# Patient Record
Sex: Male | Born: 1950 | Race: Black or African American | Hispanic: No | Smoking: Current every day smoker
Health system: Southern US, Community
[De-identification: ages and names within clinical notes are randomized; demographics above are authoritative.]

## PROBLEM LIST (undated history)

## (undated) DIAGNOSIS — E7800 Pure hypercholesterolemia, unspecified: Secondary | ICD-10-CM

## (undated) DIAGNOSIS — M549 Dorsalgia, unspecified: Secondary | ICD-10-CM

## (undated) DIAGNOSIS — I10 Essential (primary) hypertension: Secondary | ICD-10-CM

## (undated) DIAGNOSIS — C61 Malignant neoplasm of prostate: Secondary | ICD-10-CM

---

## 2001-06-06 ENCOUNTER — Emergency Department (HOSPITAL_COMMUNITY): Admission: EM | Admit: 2001-06-06 | Discharge: 2001-06-06 | Payer: Self-pay | Admitting: Emergency Medicine

## 2001-06-06 ENCOUNTER — Encounter: Payer: Self-pay | Admitting: Emergency Medicine

## 2002-06-04 ENCOUNTER — Emergency Department (HOSPITAL_COMMUNITY): Admission: EM | Admit: 2002-06-04 | Discharge: 2002-06-04 | Payer: Self-pay | Admitting: Emergency Medicine

## 2002-07-14 ENCOUNTER — Emergency Department (HOSPITAL_COMMUNITY): Admission: EM | Admit: 2002-07-14 | Discharge: 2002-07-14 | Payer: Self-pay | Admitting: Emergency Medicine

## 2005-03-25 ENCOUNTER — Encounter (INDEPENDENT_AMBULATORY_CARE_PROVIDER_SITE_OTHER): Payer: Self-pay | Admitting: Internal Medicine

## 2005-12-23 ENCOUNTER — Ambulatory Visit: Payer: Self-pay | Admitting: Internal Medicine

## 2005-12-28 ENCOUNTER — Encounter (INDEPENDENT_AMBULATORY_CARE_PROVIDER_SITE_OTHER): Payer: Self-pay | Admitting: Internal Medicine

## 2005-12-28 ENCOUNTER — Ambulatory Visit (HOSPITAL_COMMUNITY): Admission: RE | Admit: 2005-12-28 | Discharge: 2005-12-28 | Payer: Self-pay | Admitting: Internal Medicine

## 2006-01-12 ENCOUNTER — Encounter (HOSPITAL_COMMUNITY): Admission: RE | Admit: 2006-01-12 | Discharge: 2006-02-11 | Payer: Self-pay | Admitting: Internal Medicine

## 2006-01-12 ENCOUNTER — Encounter (INDEPENDENT_AMBULATORY_CARE_PROVIDER_SITE_OTHER): Payer: Self-pay | Admitting: Internal Medicine

## 2006-01-20 ENCOUNTER — Ambulatory Visit: Payer: Self-pay | Admitting: Internal Medicine

## 2006-02-17 ENCOUNTER — Ambulatory Visit: Payer: Self-pay | Admitting: Internal Medicine

## 2006-03-27 ENCOUNTER — Encounter (INDEPENDENT_AMBULATORY_CARE_PROVIDER_SITE_OTHER): Payer: Self-pay | Admitting: Internal Medicine

## 2006-03-31 ENCOUNTER — Ambulatory Visit: Payer: Self-pay | Admitting: Internal Medicine

## 2006-05-07 ENCOUNTER — Encounter (INDEPENDENT_AMBULATORY_CARE_PROVIDER_SITE_OTHER): Payer: Self-pay | Admitting: Internal Medicine

## 2006-05-08 ENCOUNTER — Encounter: Payer: Self-pay | Admitting: Internal Medicine

## 2006-05-08 DIAGNOSIS — M545 Low back pain: Secondary | ICD-10-CM

## 2006-05-08 DIAGNOSIS — H269 Unspecified cataract: Secondary | ICD-10-CM

## 2006-05-08 DIAGNOSIS — D649 Anemia, unspecified: Secondary | ICD-10-CM | POA: Insufficient documentation

## 2006-05-08 DIAGNOSIS — H409 Unspecified glaucoma: Secondary | ICD-10-CM | POA: Insufficient documentation

## 2006-05-08 DIAGNOSIS — M1990 Unspecified osteoarthritis, unspecified site: Secondary | ICD-10-CM | POA: Insufficient documentation

## 2006-05-08 DIAGNOSIS — E785 Hyperlipidemia, unspecified: Secondary | ICD-10-CM | POA: Insufficient documentation

## 2006-05-08 DIAGNOSIS — I10 Essential (primary) hypertension: Secondary | ICD-10-CM | POA: Insufficient documentation

## 2006-06-11 ENCOUNTER — Ambulatory Visit (HOSPITAL_COMMUNITY): Admission: RE | Admit: 2006-06-11 | Discharge: 2006-06-11 | Payer: Self-pay | Admitting: Family Medicine

## 2006-07-20 ENCOUNTER — Encounter (INDEPENDENT_AMBULATORY_CARE_PROVIDER_SITE_OTHER): Payer: Self-pay | Admitting: Specialist

## 2006-08-03 ENCOUNTER — Encounter (HOSPITAL_COMMUNITY): Admission: RE | Admit: 2006-08-03 | Discharge: 2006-09-02 | Payer: Self-pay | Admitting: Urology

## 2007-01-12 ENCOUNTER — Ambulatory Visit (HOSPITAL_COMMUNITY): Admission: RE | Admit: 2007-01-12 | Discharge: 2007-01-12 | Payer: Self-pay | Admitting: Radiation Oncology

## 2007-03-25 ENCOUNTER — Ambulatory Visit: Admission: RE | Admit: 2007-03-25 | Discharge: 2007-05-24 | Payer: Self-pay | Admitting: Radiation Oncology

## 2007-09-17 ENCOUNTER — Emergency Department (HOSPITAL_COMMUNITY): Admission: EM | Admit: 2007-09-17 | Discharge: 2007-09-17 | Payer: Self-pay | Admitting: Emergency Medicine

## 2013-01-24 ENCOUNTER — Emergency Department (HOSPITAL_COMMUNITY)
Admission: EM | Admit: 2013-01-24 | Discharge: 2013-01-24 | Disposition: A | Discharge status: Home / Self Care | Payer: PRIVATE HEALTH INSURANCE | Attending: Emergency Medicine | Admitting: Emergency Medicine

## 2013-01-24 ENCOUNTER — Encounter (HOSPITAL_COMMUNITY): Payer: Self-pay | Admitting: *Deleted

## 2013-01-24 DIAGNOSIS — Z79899 Other long term (current) drug therapy: Secondary | ICD-10-CM | POA: Insufficient documentation

## 2013-01-24 DIAGNOSIS — IMO0002 Reserved for concepts with insufficient information to code with codable children: Secondary | ICD-10-CM | POA: Insufficient documentation

## 2013-01-24 DIAGNOSIS — L02411 Cutaneous abscess of right axilla: Secondary | ICD-10-CM

## 2013-01-24 DIAGNOSIS — F17200 Nicotine dependence, unspecified, uncomplicated: Secondary | ICD-10-CM | POA: Insufficient documentation

## 2013-01-24 DIAGNOSIS — I10 Essential (primary) hypertension: Secondary | ICD-10-CM | POA: Insufficient documentation

## 2013-01-24 DIAGNOSIS — Z7982 Long term (current) use of aspirin: Secondary | ICD-10-CM | POA: Insufficient documentation

## 2013-01-24 DIAGNOSIS — E7800 Pure hypercholesterolemia, unspecified: Secondary | ICD-10-CM | POA: Insufficient documentation

## 2013-01-24 HISTORY — DX: Essential (primary) hypertension: I10

## 2013-01-24 HISTORY — DX: Pure hypercholesterolemia, unspecified: E78.00

## 2013-01-24 HISTORY — DX: Dorsalgia, unspecified: M54.9

## 2013-01-24 MED ORDER — LIDOCAINE HCL (PF) 1 % IJ SOLN
5.0000 mL | Freq: Once | INTRAMUSCULAR | Status: AC
  Administered 2013-01-24: 5 mL via INTRADERMAL
  Filled 2013-01-24: qty 5

## 2013-01-24 MED ORDER — SULFAMETHOXAZOLE-TRIMETHOPRIM 800-160 MG PO TABS: 1.0000 | ORAL_TABLET | Freq: Two times a day (BID) | ORAL | Status: DC

## 2013-01-24 MED ORDER — HYDROCODONE-ACETAMINOPHEN 5-325 MG PO TABS
1.0000 | ORAL_TABLET | Freq: Once | ORAL | Status: AC
  Administered 2013-01-24: 1 via ORAL
  Filled 2013-01-24: qty 1

## 2013-01-24 MED ORDER — SULFAMETHOXAZOLE-TMP DS 800-160 MG PO TABS
1.0000 | ORAL_TABLET | Freq: Once | ORAL | Status: AC
  Administered 2013-01-24: 1 via ORAL
  Filled 2013-01-24: qty 1

## 2013-01-24 MED ORDER — HYDROCODONE-ACETAMINOPHEN 5-325 MG PO TABS: ORAL_TABLET | ORAL | Status: DC

## 2013-01-24 NOTE — ED Notes (Signed)
Wound care provided. Wound dressed with sterile non-adherent telfa dressing. Secured with sterile gauze, kerlex and tape. Wound care instruction given to patient with verbal understanding obtained.

## 2013-01-24 NOTE — ED Notes (Signed)
Reports multiple abscesses under right axillary x 4 days.

## 2013-01-24 NOTE — ED Notes (Signed)
Patient reports being unable to fill his prescriptions until tomorrow due to money. Kem Parkinson, PA made aware. Patient given dose of pain medication and antibiotic prior to discharge.

## 2013-01-24 NOTE — ED Notes (Signed)
Patient with no complaints at this time. Respirations even and unlabored. Skin warm/dry. Discharge instructions reviewed with patient at this time. Patient given opportunity to voice concerns/ask questions. Patient discharged at this time and left Emergency Department with steady gait.   

## 2013-01-24 NOTE — ED Provider Notes (Signed)
CSN: [id number]     Arrival date & time 01/24/13  0944 History   First MD Initiated Contact with Patient 01/24/13 1016     Chief Complaint  Patient presents with  . Abscess   (Consider location/radiation/quality/duration/timing/severity/associated sxs/prior Treatment) Patient is a 62 y.o. male presenting with abscess. The history is provided by the patient.  Abscess Location:  Shoulder/arm Shoulder/arm abscess location:  R axilla Abscess quality: fluctuance, induration, painful and redness   Red streaking: no   Duration:  4 days Progression:  Worsening Pain details:    Quality:  Aching   Severity:  Mild   Timing:  Constant   Progression:  Worsening Chronicity:  New Context: not diabetes, not immunosuppression, not insect bite/sting and not skin injury   Relieved by:  Nothing Worsened by:  Nothing tried Ineffective treatments:  None tried Associated symptoms: no fever, no headaches, no nausea and no vomiting   Risk factors: no hx of MRSA and no prior abscess     Past Medical History  Diagnosis Date  . Hypertension   . Back pain   . High cholesterol    History reviewed. No pertinent past surgical history. No family history on file. History  Substance Use Topics  . Smoking status: Current Every Day Smoker    Types: Cigarettes  . Smokeless tobacco: Not on file  . Alcohol Use: Yes     Comment: occasional    Review of Systems  Constitutional: Negative for fever and chills.  Gastrointestinal: Negative for nausea and vomiting.  Musculoskeletal: Negative for joint swelling and arthralgias.  Skin: Positive for color change.       Abscess   Neurological: Negative for headaches.  Hematological: Negative for adenopathy.  All other systems reviewed and are negative.    Allergies  Review of patient's allergies indicates no known allergies.  Home Medications   Current Outpatient Rx  Name  Route  Sig  Dispense  Refill  . amLODipine (NORVASC) 10 MG tablet   Oral  Take 10 mg by mouth daily.         Marland Kitchen aspirin EC 81 MG tablet   Oral   Take 81 mg by mouth daily.         . naproxen (NAPROSYN) 500 MG tablet   Oral   Take 500 mg by mouth 2 (two) times daily with a meal.         . rosuvastatin (CRESTOR) 10 MG tablet   Oral   Take 10 mg by mouth daily.          BP 161/63  Pulse 77  Temp(Src) 98.8 F (37.1 C) (Oral)  Resp 16  Ht 5\' 6"  (1.676 m)  Wt 154 lb (69.854 kg)  BMI 24.87 kg/m2  SpO2 97% Physical Exam  Nursing note and vitals reviewed. Constitutional: He is oriented to person, place, and time. He appears well-developed and well-nourished. No distress.  HENT:  Head: Normocephalic and atraumatic.  Cardiovascular: Normal rate, regular rhythm and normal heart sounds.   No murmur heard. Pulmonary/Chest: Effort normal and breath sounds normal. No respiratory distress.  Musculoskeletal: Normal range of motion.  Neurological: He is alert and oriented to person, place, and time. He exhibits normal muscle tone. Coordination normal.  Skin: Skin is warm and dry. There is erythema.  Two abscesses to the right axilla.  Two pea sized pustules.  No significant surrounding erythema.      ED Course  Procedures (including critical care time) Labs Review Labs Reviewed -  No data to display Imaging Review No results found.  MDM    INCISION AND DRAINAGE Performed by: Hale Bogus. Consent: Verbal consent obtained. Risks and benefits: risks, benefits and alternatives were discussed Type: abscess #1  Body area: right axilla Anesthesia: local infiltration  Incision was made with a #11 scalpel.  Local anesthetic: lidocaine 1 % w/o epinephrine  Anesthetic total: 2 ml  Complexity: complex Blunt dissection to break up loculations  Drainage: purulent  Drainage amount: moderate Packing material: 1/4 in iodoform gauze  Patient tolerance: Patient tolerated the procedure well with no immediate complications.   INCISION AND  DRAINAGE Performed by: Hale Bogus. Consent: Verbal consent obtained. Risks and benefits: risks, benefits and alternatives were discussed Type: abscess #2  Body area: right axilla Anesthesia: local infiltration  Incision was made with a #11 scalpel.  Local anesthetic: lidocaine 1% w/o epinephrine  Anesthetic total: 2 ml  Complexity: complex Blunt dissection to break up loculations  Drainage: purulent  Drainage amount: mild  Packing material: 1/4 in iodoform gauze  Patient tolerance: Patient tolerated the procedure well with no immediate complications.    patient also has two pea sized nodules to the right axilla that do not appear to need I&D at this time.  Patient agrees to keep area bandaged and to return here in 2 days for recheck and packing removal.  VSS.  He is feeling better and appears stable for discharge.      Gilliam Hawkes L. Vanessa [place], PA-C 01/25/13 2009

## 2013-01-26 ENCOUNTER — Emergency Department (HOSPITAL_COMMUNITY)
Admission: EM | Admit: 2013-01-26 | Discharge: 2013-01-26 | Disposition: A | Discharge status: Home / Self Care | Payer: PRIVATE HEALTH INSURANCE | Attending: Emergency Medicine | Admitting: Emergency Medicine

## 2013-01-26 ENCOUNTER — Encounter (HOSPITAL_COMMUNITY): Payer: Self-pay | Admitting: Emergency Medicine

## 2013-01-26 DIAGNOSIS — IMO0002 Reserved for concepts with insufficient information to code with codable children: Secondary | ICD-10-CM | POA: Insufficient documentation

## 2013-01-26 DIAGNOSIS — L02411 Cutaneous abscess of right axilla: Secondary | ICD-10-CM

## 2013-01-26 DIAGNOSIS — E7800 Pure hypercholesterolemia, unspecified: Secondary | ICD-10-CM | POA: Insufficient documentation

## 2013-01-26 DIAGNOSIS — Z7982 Long term (current) use of aspirin: Secondary | ICD-10-CM | POA: Insufficient documentation

## 2013-01-26 DIAGNOSIS — I10 Essential (primary) hypertension: Secondary | ICD-10-CM | POA: Insufficient documentation

## 2013-01-26 DIAGNOSIS — F17200 Nicotine dependence, unspecified, uncomplicated: Secondary | ICD-10-CM | POA: Insufficient documentation

## 2013-01-26 DIAGNOSIS — Z4801 Encounter for change or removal of surgical wound dressing: Secondary | ICD-10-CM | POA: Insufficient documentation

## 2013-01-26 DIAGNOSIS — Z79899 Other long term (current) drug therapy: Secondary | ICD-10-CM | POA: Insufficient documentation

## 2013-01-26 DIAGNOSIS — Z5189 Encounter for other specified aftercare: Secondary | ICD-10-CM

## 2013-01-26 NOTE — ED Notes (Addendum)
Patient reports had two abscesses to right underarm lanced on Tuesday and came back for recheck and packing removal.

## 2013-01-26 NOTE — ED Notes (Signed)
Discharge instructions reviewed with pt, questions answered. Pt verbalized understanding.  

## 2013-01-26 NOTE — ED Provider Notes (Signed)
CSN: [id number]     Arrival date & time 01/26/13  [id number] History   First MD Initiated Contact with Patient 01/26/13 [phone]     Chief Complaint  Patient presents with  . Abscess    HPI Pt was seen at 0705.  Per pt, c/o gradual onset and persistence of constant abscess x2 to right axillary area. Pt had I&D of the abscesses 2 days ago and has come back to the ED today for a re-check and packing removal. States he has been changing the bandage regularly. Denies any complaints. Denies fevers, no worsening pain, no rash.     Past Medical History  Diagnosis Date  . Hypertension   . Back pain   . High cholesterol    History reviewed. No pertinent past surgical history.  History  Substance Use Topics  . Smoking status: Current Every Day Smoker    Types: Cigarettes  . Smokeless tobacco: Not on file  . Alcohol Use: Yes     Comment: occasional    Review of Systems ROS: Statement: All systems negative except as marked or noted in the HPI; Constitutional: Negative for fever and chills. ; ; Eyes: Negative for eye pain, redness and discharge. ; ; ENMT: Negative for ear pain, hoarseness, nasal congestion, sinus pressure and sore throat. ; ; Cardiovascular: Negative for chest pain, palpitations, diaphoresis, dyspnea and peripheral edema. ; ; Respiratory: Negative for cough, wheezing and stridor. ; ; Gastrointestinal: Negative for nausea, vomiting, diarrhea, abdominal pain, blood in stool, hematemesis, jaundice and rectal bleeding. . ; ; Genitourinary: Negative for dysuria, flank pain and hematuria. ; ; Musculoskeletal: Negative for back pain and neck pain. Negative for swelling and trauma.; ; Skin: Negative for pruritus, abrasions, blisters, bruising and +skin lesion.; ; Neuro: Negative for headache, lightheadedness and neck stiffness. Negative for weakness, altered level of consciousness , altered mental status, extremity weakness, paresthesias, involuntary movement, seizure and syncope.       Allergies   Review of patient's allergies indicates no known allergies.  Home Medications   Current Outpatient Rx  Name  Route  Sig  Dispense  Refill  . amLODipine (NORVASC) 10 MG tablet   Oral   Take 10 mg by mouth daily.         Marland Kitchen aspirin EC 81 MG tablet   Oral   Take 81 mg by mouth daily.         Marland Kitchen HYDROcodone-acetaminophen (NORCO/VICODIN) 5-325 MG per tablet      Take one-two tabs po q 4-6 hrs prn pain   20 tablet   0   . naproxen (NAPROSYN) 500 MG tablet   Oral   Take 500 mg by mouth 2 (two) times daily with a meal.         . rosuvastatin (CRESTOR) 10 MG tablet   Oral   Take 10 mg by mouth daily.         Marland Kitchen sulfamethoxazole-trimethoprim (SEPTRA DS) 800-160 MG per tablet   Oral   Take 1 tablet by mouth 2 (two) times daily.   20 tablet   0    BP 142/60  Pulse 64  Temp(Src) 97.4 F (36.3 C) (Oral)  Resp 14  Ht 5\' 6"  (1.676 m)  Wt 155 lb (70.308 kg)  BMI 25.03 kg/m2  SpO2 99% Physical Exam 0710: Physical examination:  Nursing notes reviewed; Vital signs and O2 SAT reviewed;  Constitutional: Well developed, Well nourished, Well hydrated, In no acute distress; Head:  Normocephalic, atraumatic; Eyes: EOMI, PERRL,  No scleral icterus; ENMT: Mouth and pharynx normal, Mucous membranes moist; Neck: Supple, Full range of motion; Cardiovascular: Regular rate and rhythm, No gallop; Respiratory: Breath sounds clear & equal bilaterally, No rales, rhonchi, wheezes.  Speaking full sentences with ease, Normal respiratory effort/excursion; Chest: Nontender, Movement normal;; Extremities: Pulses normal, No tenderness, No deformity..; Neuro: AA&Ox3, Major CN grossly intact.  Speech clear. Gait steady. No gross focal motor or sensory deficits in extremities.; Skin: Color normal, Warm, Dry, +2 abscesses s/p I&D right axilla without surrounding erythema, no streaking.   ED Course  Procedures   MDM  MDM Reviewed: previous chart, nursing note and vitals     0720:  Packing removed from  2 abscesses to right axillary area. No surrounding cellulitis. Will have pt continue abx and warm soaks; f/u PMD or General Surgeon. Dx d/w pt.  Questions answered. Verb understanding, agreeable to d/c home with outpt f/u.   Alfonzo Feller, DO 01/29/13 1153

## 2013-01-27 NOTE — ED Provider Notes (Signed)
Medical screening examination/treatment/procedure(s) were performed by non-physician practitioner and as supervising physician I was immediately available for consultation/collaboration.     Kathalene Frames, MD 01/27/13 [phone]

## 2014-02-01 ENCOUNTER — Ambulatory Visit (HOSPITAL_COMMUNITY)
Admission: RE | Admit: 2014-02-01 | Discharge: 2014-02-01 | Disposition: A | Discharge status: Home / Self Care | Payer: PRIVATE HEALTH INSURANCE | Source: Ambulatory Visit | Attending: Family Medicine | Admitting: Family Medicine

## 2014-02-01 ENCOUNTER — Other Ambulatory Visit (HOSPITAL_COMMUNITY): Payer: Self-pay | Admitting: Family Medicine

## 2014-02-01 DIAGNOSIS — M25562 Pain in left knee: Secondary | ICD-10-CM

## 2014-02-01 DIAGNOSIS — M25539 Pain in unspecified wrist: Secondary | ICD-10-CM | POA: Diagnosis present

## 2014-02-01 DIAGNOSIS — M778 Other enthesopathies, not elsewhere classified: Secondary | ICD-10-CM | POA: Insufficient documentation

## 2016-08-04 ENCOUNTER — Encounter (INDEPENDENT_AMBULATORY_CARE_PROVIDER_SITE_OTHER): Payer: Self-pay | Admitting: *Deleted

## 2017-07-30 ENCOUNTER — Ambulatory Visit (HOSPITAL_COMMUNITY)
Admission: RE | Admit: 2017-07-30 | Discharge: 2017-07-30 | Disposition: A | Discharge status: Home / Self Care | Payer: Medicare Other | Source: Ambulatory Visit | Attending: Family Medicine | Admitting: Family Medicine

## 2017-07-30 ENCOUNTER — Other Ambulatory Visit (HOSPITAL_COMMUNITY): Payer: Self-pay | Admitting: Family Medicine

## 2017-07-30 DIAGNOSIS — G8929 Other chronic pain: Secondary | ICD-10-CM | POA: Insufficient documentation

## 2017-07-30 DIAGNOSIS — M5136 Other intervertebral disc degeneration, lumbar region: Secondary | ICD-10-CM

## 2017-07-30 DIAGNOSIS — I700 Atherosclerosis of aorta: Secondary | ICD-10-CM | POA: Diagnosis not present

## 2017-07-30 DIAGNOSIS — M5442 Lumbago with sciatica, left side: Secondary | ICD-10-CM | POA: Diagnosis present

## 2017-07-30 DIAGNOSIS — M158 Other polyosteoarthritis: Secondary | ICD-10-CM

## 2017-08-20 ENCOUNTER — Other Ambulatory Visit (HOSPITAL_COMMUNITY): Payer: Self-pay | Admitting: Family Medicine

## 2017-08-20 DIAGNOSIS — M545 Low back pain: Secondary | ICD-10-CM

## 2017-09-06 ENCOUNTER — Ambulatory Visit (HOSPITAL_COMMUNITY)
Admission: RE | Admit: 2017-09-06 | Discharge: 2017-09-06 | Disposition: A | Discharge status: Home / Self Care | Payer: Medicare Other | Source: Ambulatory Visit | Attending: Family Medicine | Admitting: Family Medicine

## 2017-09-06 DIAGNOSIS — M48061 Spinal stenosis, lumbar region without neurogenic claudication: Secondary | ICD-10-CM | POA: Insufficient documentation

## 2017-09-06 DIAGNOSIS — M4726 Other spondylosis with radiculopathy, lumbar region: Secondary | ICD-10-CM | POA: Insufficient documentation

## 2017-09-06 DIAGNOSIS — M545 Low back pain: Secondary | ICD-10-CM | POA: Insufficient documentation

## 2018-03-22 ENCOUNTER — Other Ambulatory Visit: Payer: Self-pay | Admitting: Neurological Surgery

## 2018-03-22 DIAGNOSIS — M5416 Radiculopathy, lumbar region: Secondary | ICD-10-CM

## 2018-04-12 ENCOUNTER — Ambulatory Visit
Admission: RE | Admit: 2018-04-12 | Discharge: 2018-04-12 | Disposition: A | Discharge status: Home / Self Care | Payer: Medicaid Other | Source: Ambulatory Visit | Attending: Neurological Surgery | Admitting: Neurological Surgery

## 2018-04-12 DIAGNOSIS — M5416 Radiculopathy, lumbar region: Secondary | ICD-10-CM

## 2018-04-12 MED ORDER — IOPAMIDOL (ISOVUE-M 200) INJECTION 41%
1.0000 mL | Freq: Once | INTRAMUSCULAR | Status: AC
  Administered 2018-04-12: 1 mL via EPIDURAL

## 2018-04-12 MED ORDER — METHYLPREDNISOLONE ACETATE 40 MG/ML INJ SUSP (RADIOLOG
120.0000 mg | Freq: Once | INTRAMUSCULAR | Status: AC
  Administered 2018-04-12: 120 mg via EPIDURAL

## 2018-04-12 NOTE — Discharge Instructions (Signed)

## 2020-02-08 ENCOUNTER — Inpatient Hospital Stay (HOSPITAL_COMMUNITY)
Admission: EM | Admit: 2020-02-08 | Discharge: 2020-02-23 | DRG: 682 | Disposition: E | Payer: Medicare Other | Attending: Internal Medicine | Admitting: Internal Medicine

## 2020-02-08 ENCOUNTER — Inpatient Hospital Stay (HOSPITAL_COMMUNITY): Payer: Medicare Other

## 2020-02-08 ENCOUNTER — Emergency Department (HOSPITAL_COMMUNITY): Payer: Medicare Other

## 2020-02-08 ENCOUNTER — Encounter (HOSPITAL_COMMUNITY): Payer: Self-pay

## 2020-02-08 DIAGNOSIS — R001 Bradycardia, unspecified: Secondary | ICD-10-CM | POA: Diagnosis present

## 2020-02-08 DIAGNOSIS — F17210 Nicotine dependence, cigarettes, uncomplicated: Secondary | ICD-10-CM | POA: Diagnosis present

## 2020-02-08 DIAGNOSIS — E43 Unspecified severe protein-calorie malnutrition: Secondary | ICD-10-CM | POA: Diagnosis not present

## 2020-02-08 DIAGNOSIS — Y842 Radiological procedure and radiotherapy as the cause of abnormal reaction of the patient, or of later complication, without mention of misadventure at the time of the procedure: Secondary | ICD-10-CM | POA: Diagnosis not present

## 2020-02-08 DIAGNOSIS — C7802 Secondary malignant neoplasm of left lung: Secondary | ICD-10-CM | POA: Diagnosis present

## 2020-02-08 DIAGNOSIS — C7801 Secondary malignant neoplasm of right lung: Secondary | ICD-10-CM | POA: Diagnosis not present

## 2020-02-08 DIAGNOSIS — N186 End stage renal disease: Secondary | ICD-10-CM | POA: Diagnosis present

## 2020-02-08 DIAGNOSIS — Z515 Encounter for palliative care: Secondary | ICD-10-CM | POA: Diagnosis not present

## 2020-02-08 DIAGNOSIS — Z20822 Contact with and (suspected) exposure to covid-19: Secondary | ICD-10-CM | POA: Diagnosis present

## 2020-02-08 DIAGNOSIS — N179 Acute kidney failure, unspecified: Principal | ICD-10-CM | POA: Diagnosis present

## 2020-02-08 DIAGNOSIS — E875 Hyperkalemia: Secondary | ICD-10-CM | POA: Diagnosis present

## 2020-02-08 DIAGNOSIS — Z7189 Other specified counseling: Secondary | ICD-10-CM | POA: Diagnosis not present

## 2020-02-08 DIAGNOSIS — G9341 Metabolic encephalopathy: Secondary | ICD-10-CM | POA: Diagnosis not present

## 2020-02-08 DIAGNOSIS — R570 Cardiogenic shock: Secondary | ICD-10-CM | POA: Diagnosis present

## 2020-02-08 DIAGNOSIS — I120 Hypertensive chronic kidney disease with stage 5 chronic kidney disease or end stage renal disease: Secondary | ICD-10-CM | POA: Diagnosis present

## 2020-02-08 DIAGNOSIS — Z66 Do not resuscitate: Secondary | ICD-10-CM | POA: Diagnosis not present

## 2020-02-08 DIAGNOSIS — E785 Hyperlipidemia, unspecified: Secondary | ICD-10-CM | POA: Diagnosis not present

## 2020-02-08 DIAGNOSIS — J9601 Acute respiratory failure with hypoxia: Secondary | ICD-10-CM | POA: Diagnosis not present

## 2020-02-08 DIAGNOSIS — N170 Acute kidney failure with tubular necrosis: Secondary | ICD-10-CM | POA: Diagnosis not present

## 2020-02-08 DIAGNOSIS — Z7982 Long term (current) use of aspirin: Secondary | ICD-10-CM | POA: Diagnosis not present

## 2020-02-08 DIAGNOSIS — K7200 Acute and subacute hepatic failure without coma: Secondary | ICD-10-CM | POA: Diagnosis present

## 2020-02-08 DIAGNOSIS — N171 Acute kidney failure with acute cortical necrosis: Secondary | ICD-10-CM | POA: Diagnosis not present

## 2020-02-08 DIAGNOSIS — Z79899 Other long term (current) drug therapy: Secondary | ICD-10-CM

## 2020-02-08 DIAGNOSIS — D62 Acute posthemorrhagic anemia: Secondary | ICD-10-CM | POA: Diagnosis not present

## 2020-02-08 DIAGNOSIS — C787 Secondary malignant neoplasm of liver and intrahepatic bile duct: Secondary | ICD-10-CM | POA: Diagnosis present

## 2020-02-08 DIAGNOSIS — N3041 Irradiation cystitis with hematuria: Secondary | ICD-10-CM | POA: Diagnosis not present

## 2020-02-08 DIAGNOSIS — R680 Hypothermia, not associated with low environmental temperature: Secondary | ICD-10-CM | POA: Diagnosis not present

## 2020-02-08 DIAGNOSIS — Z6820 Body mass index (BMI) 20.0-20.9, adult: Secondary | ICD-10-CM

## 2020-02-08 DIAGNOSIS — E872 Acidosis: Secondary | ICD-10-CM | POA: Diagnosis present

## 2020-02-08 DIAGNOSIS — J9690 Respiratory failure, unspecified, unspecified whether with hypoxia or hypercapnia: Secondary | ICD-10-CM

## 2020-02-08 DIAGNOSIS — E7800 Pure hypercholesterolemia, unspecified: Secondary | ICD-10-CM | POA: Diagnosis present

## 2020-02-08 DIAGNOSIS — T68XXXA Hypothermia, initial encounter: Secondary | ICD-10-CM

## 2020-02-08 DIAGNOSIS — J9600 Acute respiratory failure, unspecified whether with hypoxia or hypercapnia: Secondary | ICD-10-CM | POA: Diagnosis not present

## 2020-02-08 DIAGNOSIS — E162 Hypoglycemia, unspecified: Secondary | ICD-10-CM | POA: Diagnosis not present

## 2020-02-08 DIAGNOSIS — I313 Pericardial effusion (noninflammatory): Secondary | ICD-10-CM | POA: Diagnosis not present

## 2020-02-08 DIAGNOSIS — M898X9 Other specified disorders of bone, unspecified site: Secondary | ICD-10-CM | POA: Diagnosis present

## 2020-02-08 DIAGNOSIS — Z452 Encounter for adjustment and management of vascular access device: Secondary | ICD-10-CM

## 2020-02-08 DIAGNOSIS — C61 Malignant neoplasm of prostate: Secondary | ICD-10-CM | POA: Diagnosis present

## 2020-02-08 DIAGNOSIS — Z01818 Encounter for other preprocedural examination: Secondary | ICD-10-CM

## 2020-02-08 HISTORY — DX: Malignant neoplasm of prostate: C61

## 2020-02-08 LAB — CBC WITH DIFFERENTIAL/PLATELET
Abs Immature Granulocytes: 0.22 10*3/uL — ABNORMAL HIGH (ref 0.00–0.07)
Basophils Absolute: 0 10*3/uL (ref 0.0–0.1)
Basophils Relative: 0 %
Eosinophils Absolute: 0 10*3/uL (ref 0.0–0.5)
Eosinophils Relative: 0 %
HCT: 21.5 % — ABNORMAL LOW (ref 39.0–52.0)
Hemoglobin: 7.4 g/dL — ABNORMAL LOW (ref 13.0–17.0)
Immature Granulocytes: 1 %
Lymphocytes Relative: 9 %
Lymphs Abs: 1.5 10*3/uL (ref 0.7–4.0)
MCH: 24.4 pg — ABNORMAL LOW (ref 26.0–34.0)
MCHC: 34.4 g/dL (ref 30.0–36.0)
MCV: 71 fL — ABNORMAL LOW (ref 80.0–100.0)
Monocytes Absolute: 1.9 10*3/uL — ABNORMAL HIGH (ref 0.1–1.0)
Monocytes Relative: 11 %
Neutro Abs: 13.3 10*3/uL — ABNORMAL HIGH (ref 1.7–7.7)
Neutrophils Relative %: 79 %
Platelets: 403 10*3/uL — ABNORMAL HIGH (ref 150–400)
RBC: 3.03 MIL/uL — ABNORMAL LOW (ref 4.22–5.81)
RDW: 15.2 % (ref 11.5–15.5)
WBC: 16.9 10*3/uL — ABNORMAL HIGH (ref 4.0–10.5)
nRBC: 0 % (ref 0.0–0.2)

## 2020-02-08 LAB — I-STAT CHEM 8, ED
BUN: 84 mg/dL — ABNORMAL HIGH (ref 8–23)
BUN: 80 mg/dL — ABNORMAL HIGH (ref 8–23)
Calcium, Ion: 0.62 mmol/L — CL (ref 1.15–1.40)
Calcium, Ion: 0.64 mmol/L — CL (ref 1.15–1.40)
Chloride: 103 mmol/L (ref 98–111)
Chloride: 104 mmol/L (ref 98–111)
Creatinine, Ser: 14.5 mg/dL — ABNORMAL HIGH (ref 0.61–1.24)
Creatinine, Ser: 14.3 mg/dL — ABNORMAL HIGH (ref 0.61–1.24)
Glucose, Bld: 103 mg/dL — ABNORMAL HIGH (ref 70–99)
Glucose, Bld: 140 mg/dL — ABNORMAL HIGH (ref 70–99)
HCT: 22 % — ABNORMAL LOW (ref 39.0–52.0)
HCT: 19 % — ABNORMAL LOW (ref 39.0–52.0)
Hemoglobin: 7.5 g/dL — ABNORMAL LOW (ref 13.0–17.0)
Hemoglobin: 6.5 g/dL — CL (ref 13.0–17.0)
Potassium: 7 mmol/L (ref 3.5–5.1)
Potassium: 5.9 mmol/L — ABNORMAL HIGH (ref 3.5–5.1)
Sodium: 130 mmol/L — ABNORMAL LOW (ref 135–145)
Sodium: 134 mmol/L — ABNORMAL LOW (ref 135–145)
TCO2: 11 mmol/L — ABNORMAL LOW (ref 22–32)
TCO2: 13 mmol/L — ABNORMAL LOW (ref 22–32)

## 2020-02-08 LAB — SARS CORONAVIRUS 2 BY RT PCR (HOSPITAL ORDER, PERFORMED IN ~~LOC~~ HOSPITAL LAB): SARS Coronavirus 2: NEGATIVE

## 2020-02-08 LAB — MAGNESIUM: Magnesium: 2.6 mg/dL — ABNORMAL HIGH (ref 1.7–2.4)

## 2020-02-08 LAB — COMPREHENSIVE METABOLIC PANEL
ALT: 43 U/L (ref 0–44)
AST: 370 U/L — ABNORMAL HIGH (ref 15–41)
Albumin: 1.7 g/dL — ABNORMAL LOW (ref 3.5–5.0)
Alkaline Phosphatase: 391 U/L — ABNORMAL HIGH (ref 38–126)
Anion gap: >20 — ABNORMAL HIGH (ref 5–15)
BUN: 99 mg/dL — ABNORMAL HIGH (ref 8–23)
CO2: 10 mmol/L — ABNORMAL LOW (ref 22–32)
Calcium: 5.2 mg/dL — CL (ref 8.9–10.3)
Chloride: 99 mmol/L (ref 98–111)
Creatinine, Ser: 13.1 mg/dL — ABNORMAL HIGH (ref 0.61–1.24)
GFR calc Af Amer: 4 mL/min — ABNORMAL LOW (ref >60)
GFR calc non Af Amer: 3 mL/min — ABNORMAL LOW (ref >60)
Glucose, Bld: 109 mg/dL — ABNORMAL HIGH (ref 70–99)
Potassium: 7.3 mmol/L (ref 3.5–5.1)
Sodium: 133 mmol/L — ABNORMAL LOW (ref 135–145)
Total Bilirubin: 3.6 mg/dL — ABNORMAL HIGH (ref 0.3–1.2)
Total Protein: 5.8 g/dL — ABNORMAL LOW (ref 6.5–8.1)

## 2020-02-08 LAB — TYPE AND SCREEN
ABO/RH(D): A POS
Antibody Screen: NEGATIVE

## 2020-02-08 LAB — TROPONIN I (HIGH SENSITIVITY)
Troponin I (High Sensitivity): 89 ng/L — ABNORMAL HIGH (ref <18)
Troponin I (High Sensitivity): 85 ng/L — ABNORMAL HIGH (ref <18)

## 2020-02-08 LAB — PROCALCITONIN: Procalcitonin: 26.82 ng/mL

## 2020-02-08 LAB — POC OCCULT BLOOD, ED: Fecal Occult Bld: POSITIVE — AB

## 2020-02-08 LAB — GLUCOSE, CAPILLARY: Glucose-Capillary: 127 mg/dL — ABNORMAL HIGH (ref 70–99)

## 2020-02-08 LAB — CK: Total CK: 2982 U/L — ABNORMAL HIGH (ref 49–397)

## 2020-02-08 LAB — PHOSPHORUS: Phosphorus: 15.2 mg/dL — ABNORMAL HIGH (ref 2.5–4.6)

## 2020-02-08 LAB — PROTIME-INR
INR: 2.2 — ABNORMAL HIGH (ref 0.8–1.2)
Prothrombin Time: 23.3 seconds — ABNORMAL HIGH (ref 11.4–15.2)

## 2020-02-08 LAB — LACTIC ACID, PLASMA: Lactic Acid, Venous: 2 mmol/L (ref 0.5–1.9)

## 2020-02-08 LAB — CBG MONITORING, ED: Glucose-Capillary: 151 mg/dL — ABNORMAL HIGH (ref 70–99)

## 2020-02-08 MED ORDER — FUROSEMIDE 10 MG/ML IJ SOLN
INTRAMUSCULAR | Status: AC
  Filled 2020-02-08: qty 4

## 2020-02-08 MED ORDER — MAGNESIUM SULFATE 2 GM/50ML IV SOLN
2.0000 g | INTRAVENOUS | Status: AC
  Administered 2020-02-08: 2 g via INTRAVENOUS
  Filled 2020-02-08: qty 50

## 2020-02-08 MED ORDER — NOREPINEPHRINE 4 MG/250ML-% IV SOLN: 0.0000 ug/min | INTRAVENOUS | Status: DC

## 2020-02-08 MED ORDER — VASOPRESSIN 20 UNITS/100 ML INFUSION FOR SHOCK
0.0300 [IU]/min | INTRAVENOUS | Status: DC
  Administered 2020-02-08: 0.03 [IU]/min via INTRAVENOUS
  Administered 2020-02-09: 0.01 [IU]/min via INTRAVENOUS
  Filled 2020-02-08 (×2): qty 100

## 2020-02-08 MED ORDER — SODIUM CHLORIDE 0.9 % IV BOLUS
1000.0000 mL | Freq: Once | INTRAVENOUS | Status: AC
  Administered 2020-02-08: 1000 mL via INTRAVENOUS

## 2020-02-08 MED ORDER — HEPARIN (PORCINE) 2000 UNITS/L FOR CRRT
INTRAVENOUS_CENTRAL | Status: DC | PRN
  Filled 2020-02-08 (×2): qty 1000

## 2020-02-08 MED ORDER — SODIUM BICARBONATE 8.4 % IV SOLN
INTRAVENOUS | Status: AC
  Filled 2020-02-08: qty 50

## 2020-02-08 MED ORDER — HEPARIN SODIUM (PORCINE) 1000 UNIT/ML DIALYSIS
1000.0000 [IU] | INTRAMUSCULAR | Status: DC | PRN
  Administered 2020-02-09: 3100 [IU] via INTRAVENOUS_CENTRAL
  Filled 2020-02-08: qty 3
  Filled 2020-02-08 (×2): qty 6

## 2020-02-08 MED ORDER — INSULIN ASPART 100 UNIT/ML IV SOLN
5.0000 [IU] | Freq: Once | INTRAVENOUS | Status: AC
  Administered 2020-02-08: 5 [IU] via INTRAVENOUS

## 2020-02-08 MED ORDER — STERILE WATER FOR INJECTION IV SOLN
INTRAVENOUS | Status: DC
  Filled 2020-02-08 (×3): qty 9.62

## 2020-02-08 MED ORDER — POLYETHYLENE GLYCOL 3350 17 G PO PACK: 17.0000 g | PACK | Freq: Every day | ORAL | Status: DC | PRN

## 2020-02-08 MED ORDER — FUROSEMIDE 10 MG/ML IJ SOLN
80.0000 mg | Freq: Once | INTRAMUSCULAR | Status: AC
  Administered 2020-02-08: 80 mg via INTRAVENOUS
  Filled 2020-02-08: qty 8

## 2020-02-08 MED ORDER — NOREPINEPHRINE 4 MG/250ML-% IV SOLN: 2.0000 ug/min | INTRAVENOUS | Status: DC

## 2020-02-08 MED ORDER — SODIUM CHLORIDE 0.9 % IV SOLN: 250.0000 mL | INTRAVENOUS | Status: DC

## 2020-02-08 MED ORDER — DEXTROSE 50 % IV SOLN
1.0000 | Freq: Once | INTRAVENOUS | Status: AC
  Filled 2020-02-08: qty 50

## 2020-02-08 MED ORDER — FUROSEMIDE 40 MG PO TABS
80.0000 mg | ORAL_TABLET | Freq: Once | ORAL | Status: DC
  Filled 2020-02-08 (×2): qty 2

## 2020-02-08 MED ORDER — DOCUSATE SODIUM 100 MG PO CAPS: 100.0000 mg | ORAL_CAPSULE | Freq: Two times a day (BID) | ORAL | Status: DC | PRN

## 2020-02-08 MED ORDER — ONDANSETRON HCL 4 MG/2ML IJ SOLN: 4.0000 mg | Freq: Four times a day (QID) | INTRAMUSCULAR | Status: DC | PRN

## 2020-02-08 MED ORDER — SODIUM BICARBONATE 8.4 % IV SOLN
50.0000 meq | Freq: Once | INTRAVENOUS | Status: AC
  Administered 2020-02-08: 50 meq via INTRAVENOUS

## 2020-02-08 MED ORDER — NOREPINEPHRINE 4 MG/250ML-% IV SOLN
0.0000 ug/min | INTRAVENOUS | Status: DC
  Administered 2020-02-08: 15 ug/min via INTRAVENOUS
  Administered 2020-02-09: 30 ug/min via INTRAVENOUS
  Administered 2020-02-09: 10 ug/min via INTRAVENOUS
  Administered 2020-02-09: 40 ug/min via INTRAVENOUS
  Administered 2020-02-09: 11 ug/min via INTRAVENOUS
  Filled 2020-02-08 (×2): qty 250
  Filled 2020-02-08: qty 500
  Filled 2020-02-08 (×3): qty 250

## 2020-02-08 MED ORDER — NOREPINEPHRINE 4 MG/250ML-% IV SOLN: 5.0000 ug/min | INTRAVENOUS | Status: DC

## 2020-02-08 MED ORDER — LACTATED RINGERS IV BOLUS: 1000.0000 mL | Freq: Once | INTRAVENOUS | Status: DC

## 2020-02-08 MED ORDER — PRISMASOL BGK 0/2.5 32-2.5 MEQ/L IV SOLN
INTRAVENOUS | Status: DC
  Filled 2020-02-08 (×4): qty 5000

## 2020-02-08 MED ORDER — PRISMASOL BGK 0/2.5 32-2.5 MEQ/L IV SOLN
INTRAVENOUS | Status: DC
  Filled 2020-02-08 (×6): qty 5000

## 2020-02-08 MED ORDER — SODIUM CHLORIDE 0.9 % IV SOLN: INTRAVENOUS | Status: DC

## 2020-02-08 MED ORDER — PANTOPRAZOLE SODIUM 40 MG IV SOLR
40.0000 mg | Freq: Every day | INTRAVENOUS | Status: DC
  Administered 2020-02-09 (×2): 40 mg via INTRAVENOUS
  Filled 2020-02-08 (×2): qty 40

## 2020-02-08 MED ORDER — SODIUM ZIRCONIUM CYCLOSILICATE 5 G PO PACK
10.0000 g | PACK | ORAL | Status: AC
  Administered 2020-02-08: 10 g via ORAL
  Filled 2020-02-08: qty 2

## 2020-02-08 MED ORDER — CALCIUM GLUCONATE 10 % IV SOLN
1.0000 g | Freq: Once | INTRAVENOUS | Status: AC
  Administered 2020-02-08: 1 g via INTRAVENOUS
  Filled 2020-02-08: qty 10

## 2020-02-08 MED ORDER — PRISMASOL BGK 0/2.5 32-2.5 MEQ/L IV SOLN
INTRAVENOUS | Status: DC
  Filled 2020-02-08 (×22): qty 5000

## 2020-02-08 MED ORDER — DEXTROSE 50 % IV SOLN
INTRAVENOUS | Status: AC
  Administered 2020-02-08: 50 mL via INTRAVENOUS
  Filled 2020-02-08: qty 50

## 2020-02-08 MED ORDER — SODIUM BICARBONATE 8.4 % IV SOLN
Freq: Once | INTRAVENOUS | Status: DC
  Filled 2020-02-08: qty 100

## 2020-02-08 MED ORDER — NOREPINEPHRINE 4 MG/250ML-% IV SOLN
INTRAVENOUS | Status: AC
  Administered 2020-02-08: 2 ug/min via INTRAVENOUS
  Filled 2020-02-08: qty 250

## 2020-02-08 NOTE — Consult Note (Addendum)
[facility] Nephrology Consultation Note  Requesting MD: Dr. Sabra Heck, [place] Reason for consult: AKI, Hyperkalemia.  HPI:  Jason Good is a 69 y.o. male with past medical history significant for hypertension, HLD, osteoarthritis, presented to [facility] ER for shortness of breath, weakness seen as a consultation for the evaluation of AKI and hyperkalemia. Initially, the EMS found bradycardic to 95J and systolic blood pressure around 90s. In the ER, he was hypothermic to 93, SBP as low as 70s. The initial labs showed serum sodium of 130, potassium 7, CO2 10, BUN 84, creatinine level of 14.5, calcium 5.2, hemoglobin 7.5. The EKG consistent with peaked T wave. He was medically managed with normal saline bolus, insulin, dextrose, calcium gluconate, Lokelma, sodium bicarbonate. The chest x-ray consistent with mild cardiomegaly and pulmonary vascular congestion. Received IV Lasix to manage hyperkalemia. The repeat labs showed serum potassium level worsened to 7.3, BUN 99 and creatinine level 13.1. Discussed with the ER physician and decided to transfer the patient to [facility] for emergent renal replacement therapy. A right femoral temporary HD catheter was placed by [facility] ER physician. The recent baseline serum creatinine level unknown. As per patient he was recently started on blood pressure medication by his PCP. He was feeling weak tired and not eating much. The home medications include lisinopril 20 mg, amlodipine, aspirin, labetalol, naproxen 500 mg twice a day, oxycodone and Crestor. No improvement in SBP with IV fluid therefore Levophed started. Patient is alert awake and oriented to hospital but not really oriented to month. He was able to tell 2022. Review of system is limited.   Creatinine, Ser  Date/Time Value Ref Range Status  02/16/2020 07:07 PM 14.30 (H) 0.61 - 1.24 mg/dL Final  02/12/2020 05:00 PM 13.10 (H) 0.61 - 1.24 mg/dL Final  02/11/2020 04:59 PM 14.50 (H) 0.61 - 1.24  mg/dL Final  01/12/2007 10:54 AM 1.1  Final     PMHx:   Past Medical History:  Diagnosis Date  . Back pain   . High cholesterol   . Hypertension     No past surgical history on file.  Family Hx: No family history on file.  Social History:  reports that he has been smoking cigarettes. He does not have any smokeless tobacco history on file. He reports current alcohol use. He reports that he does not use drugs.  Allergies: No Known Allergies  Medications: Prior to Admission medications   Medication Sig Start Date End Date Taking? Authorizing Provider  amLODipine (NORVASC) 10 MG tablet Take 10 mg by mouth daily.    [provider]  aspirin EC 81 MG tablet Take 81 mg by mouth daily.    [provider]  labetalol (NORMODYNE) 200 MG tablet Take 200 mg by mouth 2 (two) times daily. 01/31/20   [provider]  lisinopril (ZESTRIL) 20 MG tablet Take 20 mg by mouth daily. 02/06/20   [provider]  naproxen (NAPROSYN) 500 MG tablet Take 500 mg by mouth 2 (two) times daily with a meal.    [provider]  oxyCODONE (OXY IR/ROXICODONE) 5 MG immediate release tablet Take 5 mg by mouth 3 (three) times daily. 01/31/20   [provider]  rosuvastatin (CRESTOR) 10 MG tablet Take 10 mg by mouth daily.    [provider]    I have reviewed the patient's current medications.  Labs:  Results for orders placed or performed during the hospital encounter of 02/19/2020 (from the past 48 hour(s))  I-stat chem  8, ED (not at [facility] or [facility])     Status: Abnormal   Collection Time: 01/29/2020  4:59 PM  Result Value Ref Range   Sodium 130 (L) 135 - 145 mmol/L   Potassium 7.0 (HH) 3.5 - 5.1 mmol/L   Chloride 103 98 - 111 mmol/L   BUN 84 (H) 8 - 23 mg/dL   Creatinine, Ser 14.50 (H) 0.61 - 1.24 mg/dL   Glucose, Bld 103 (H) 70 - 99 mg/dL    Comment: Glucose reference range applies only to samples taken after fasting for at least 8 hours.   Calcium, Ion  0.62 (LL) 1.15 - 1.40 mmol/L   TCO2 11 (L) 22 - 32 mmol/L   Hemoglobin 7.5 (L) 13.0 - 17.0 g/dL   HCT 22.0 (L) 39 - 52 %   Comment NOTIFIED PHYSICIAN   CBC with Differential/Platelet     Status: Abnormal   Collection Time: 02/21/2020  5:00 PM  Result Value Ref Range   WBC 16.9 (H) 4.0 - 10.5 K/uL   RBC 3.03 (L) 4.22 - 5.81 MIL/uL   Hemoglobin 7.4 (L) 13.0 - 17.0 g/dL    Comment: Reticulocyte Hemoglobin testing may be clinically indicated, consider ordering this additional test [id number]    HCT 21.5 (L) 39 - 52 %   MCV 71.0 (L) 80.0 - 100.0 fL   MCH 24.4 (L) 26.0 - 34.0 pg   MCHC 34.4 30.0 - 36.0 g/dL   RDW 15.2 11.5 - 15.5 %   Platelets 403 (H) 150 - 400 K/uL   nRBC 0.0 0.0 - 0.2 %   Neutrophils Relative % 79 %   Neutro Abs 13.3 (H) 1.7 - 7.7 K/uL   Lymphocytes Relative 9 %   Lymphs Abs 1.5 0.7 - 4.0 K/uL   Monocytes Relative 11 %   Monocytes Absolute 1.9 (H) 0 - 1 K/uL   Eosinophils Relative 0 %   Eosinophils Absolute 0.0 0 - 0 K/uL   Basophils Relative 0 %   Basophils Absolute 0.0 0 - 0 K/uL   WBC Morphology MILD LEFT SHIFT (1-5% METAS, OCC MYELO, OCC BANDS)     Comment: TOXIC GRANULATION VACUOLATED NEUTROPHILS BANDS PRESENT    Immature Granulocytes 1 %   Abs Immature Granulocytes 0.22 (H) 0.00 - 0.07 K/uL   Burr Cells PRESENT    Polychromasia PRESENT    Target Cells PRESENT     Comment: Performed at [facility], [address], [place], [place] [address]  Comprehensive metabolic panel     Status: Abnormal   Collection Time: 02/04/2020  5:00 PM  Result Value Ref Range   Sodium 133 (L) 135 - 145 mmol/L   Potassium 7.3 (HH) 3.5 - 5.1 mmol/L    Comment: CRITICAL RESULT CALLED TO, READ BACK BY AND VERIFIED WITH: WILEY,E ON 02/14/2020 AT 1830 BY LOY,C    Chloride 99 98 - 111 mmol/L   CO2 10 (L) 22 - 32 mmol/L   Glucose, Bld 109 (H) 70 - 99 mg/dL    Comment: Glucose reference range applies only to samples taken after fasting for at least 8 hours.   BUN 99 (H) 8 - 23 mg/dL    Creatinine, Ser 13.10 (H) 0.61 - 1.24 mg/dL   Calcium 5.2 (LL) 8.9 - 10.3 mg/dL    Comment: CRITICAL RESULT CALLED TO, READ BACK BY AND VERIFIED WITH: WILEY,E ON 02/09/2020 AT 1830 BY LOY,C    Total Protein 5.8 (L) 6.5 - 8.1 g/dL   Albumin 1.7 (L) 3.5 -  5.0 g/dL   AST 370 (H) 15 - 41 U/L   ALT 43 0 - 44 U/L   Alkaline Phosphatase 391 (H) 38 - 126 U/L   Total Bilirubin 3.6 (H) 0.3 - 1.2 mg/dL   GFR calc non Af Amer 3 (L) >60 mL/min   GFR calc Af Amer 4 (L) >60 mL/min   Anion gap >20 (H) 5 - 15    Comment: Performed at [facility], [address], [place], [place] [address]  Troponin I (High Sensitivity)     Status: Abnormal   Collection Time: 02/02/2020  5:00 PM  Result Value Ref Range   Troponin I (High Sensitivity) 89 (H) <18 ng/L    Comment: (NOTE) Elevated high sensitivity troponin I (hsTnI) values and significant  changes across serial measurements may suggest ACS but many other  chronic and acute conditions are known to elevate hsTnI results.  Refer to the Links section for chest pain algorithms and additional  guidance. Performed at [facility], [address] [place], [place] [address]   Protime-INR     Status: Abnormal   Collection Time: 02/12/2020  5:00 PM  Result Value Ref Range   Prothrombin Time 23.3 (H) 11.4 - 15.2 seconds   INR 2.2 (H) 0.8 - 1.2    Comment: (NOTE) INR goal varies based on device and disease states. Performed at [facility], [address] [place], [place] [address]   Magnesium     Status: Abnormal   Collection Time: 02/21/2020  5:00 PM  Result Value Ref Range   Magnesium 2.6 (H) 1.7 - 2.4 mg/dL    Comment: Performed at [facility], [address], [place], [place] [address]  SARS Coronavirus 2 by RT PCR (hospital order, performed in [facility] hospital lab) Nasopharyngeal Nasopharyngeal Swab     Status: None   Collection Time: 01/30/2020  5:07 PM   Specimen: Nasopharyngeal Swab  Result Value Ref Range   SARS Coronavirus 2 NEGATIVE NEGATIVE     Comment: (NOTE) SARS-CoV-2 target nucleic acids are NOT DETECTED.  The SARS-CoV-2 RNA is generally detectable in upper and lower respiratory specimens during the acute phase of infection. The lowest concentration of SARS-CoV-2 viral copies this assay can detect is 250 copies / mL. A negative result does not preclude SARS-CoV-2 infection and should not be used as the sole basis for treatment or other patient management decisions.  A negative result may occur with improper specimen collection / handling, submission of specimen other than nasopharyngeal swab, presence of viral mutation(s) within the areas targeted by this assay, and inadequate number of viral copies (<250 copies / mL). A negative result must be combined with clinical observations, patient history, and epidemiological information.  Fact Sheet for Patients:   StrictlyIdeas.no  Fact Sheet for Healthcare Providers: BankingDealers.co.za  This test is not yet approved or  cleared by the [place] FDA and has been authorized for detection and/or diagnosis of SARS-CoV-2 by FDA under an Emergency Use Authorization (EUA).  This EUA will remain in effect (meaning this test can be used) for the duration of the COVID-19 declaration under Section 564(b)(1) of the Act, 21 U.S.C. section 360bbb-3(b)(1), unless the authorization is terminated or revoked sooner.  Performed at [facility], [address] [place],  [address]   Type and screen     Status: None   Collection Time: 01/28/2020  5:28 PM  Result Value Ref Range   ABO/RH(D) A POS    Antibody Screen NEG  Sample Expiration      02/11/2020,2359 Performed at [facility], [address], [place], [place] [address]   POC occult blood, ED RN will collect     Status: Abnormal   Collection Time: 02/05/2020  6:53 PM  Result Value Ref Range   Fecal Occult Bld POSITIVE (A) NEGATIVE  CBG monitoring, ED     Status: Abnormal    Collection Time: 02/22/2020  7:00 PM  Result Value Ref Range   Glucose-Capillary 151 (H) 70 - 99 mg/dL    Comment: Glucose reference range applies only to samples taken after fasting for at least 8 hours.  Troponin I (High Sensitivity)     Status: Abnormal   Collection Time: 02/11/2020  7:04 PM  Result Value Ref Range   Troponin I (High Sensitivity) 85 (H) <18 ng/L    Comment: (NOTE) Elevated high sensitivity troponin I (hsTnI) values and significant  changes across serial measurements may suggest ACS but many other  chronic and acute conditions are known to elevate hsTnI results.  Refer to the "Links" section for chest pain algorithms and additional  guidance. Performed at [facility], [address] [place], [place] [address]   Phosphorus     Status: Abnormal   Collection Time: 02/18/2020  7:04 PM  Result Value Ref Range   Phosphorus 15.2 (H) 2.5 - 4.6 mg/dL    Comment: RESULTS CONFIRMED BY MANUAL DILUTION Performed at [facility], [address], [place], [place] [address]   I-stat chem 8, ed     Status: Abnormal   Collection Time: 02/17/2020  7:07 PM  Result Value Ref Range   Sodium 134 (L) 135 - 145 mmol/L   Potassium 5.9 (H) 3.5 - 5.1 mmol/L   Chloride 104 98 - 111 mmol/L   BUN 80 (H) 8 - 23 mg/dL   Creatinine, Ser 14.30 (H) 0.61 - 1.24 mg/dL   Glucose, Bld 140 (H) 70 - 99 mg/dL    Comment: Glucose reference range applies only to samples taken after fasting for at least 8 hours.   Calcium, Ion 0.64 (LL) 1.15 - 1.40 mmol/L   TCO2 13 (L) 22 - 32 mmol/L   Hemoglobin 6.5 (LL) 13.0 - 17.0 g/dL   HCT 19.0 (L) 39 - 52 %  Glucose, capillary     Status: Abnormal   Collection Time: 02/05/2020  8:50 PM  Result Value Ref Range   Glucose-Capillary 127 (H) 70 - 99 mg/dL    Comment: Glucose reference range applies only to samples taken after fasting for at least 8 hours.     ROS:  Pertinent items noted in HPI and remainder of comprehensive ROS otherwise negative.  Physical  Exam: Vitals:   01/29/2020 1755 02/17/2020 2028  BP:  (!) 87/51  Pulse: (!) 57 (!) 57  Resp: 18 19  Temp:    SpO2: 99% 95%     General exam: Ill-looking male lying on bed with dry mucous membrane Respiratory system: Clear to auscultation. Respiratory effort normal. No wheezing or crackle Cardiovascular system: S1 & S2 heard, RRR.  No pedal edema. Gastrointestinal system: Abdomen is nondistended, soft and nontender. Normal bowel sounds heard. Central nervous system: Alert and oriented to hospital and 2022, unable to say month. Extremities: No edema, no cyanosis or clubbing.. Skin: No rashes, lesions or ulcers Psychiatry: Looks confused. Dialysis Access: Right femoral line, site looks clean without bleeding  Assessment/Plan:  #Severe renal failure: Exact baseline creatinine level unknown however concerning for possible acute kidney injury on  CKD due to hypotension/shock with recent starting antihypertensive medication including lisinopril concomitant with NSAIDs use, decreased oral intake  versus progressive advanced CKD. Patient now with severe life-threatening hyperkalemia, acidosis and uremia. Plan to initiate CRRT. Right femoral HD catheter was placed in [facility] ER. Plan to use 2K bath in all CRRT fluid, no net UF because of hypotension. No systemic heparin because of anemia. I will order further evaluation including urinalysis, urine sodium, US kidneys. Reportedly the bedside bladder scan without any urine.  #Severe life-threatening hyperkalemia with bradycardia and EKG changes in the setting of renal failure, lisinopril use: Status post medical treatment. Plan to initiate dialysis as mentioned above.  #Anion gap metabolic acidosis with renal failure: Start IV sodium bicarbonate.  #Hypocalcemia/CKD MBD: Received IV calcium. Given advanced renal failure I will order PTH, serum phosphorus level. Continue to monitor serum calcium level and replete calcium IV.  #Severe anemia related with  advanced renal failure: Check iron studies. Transfuse a unit of PRBC.Recommend further evaluation including a stool occult blood test.  #Hypotensive shock: Systolic blood pressure as low as 70s in ER. Currently on Levophed. Maintain MAP.  #HLD: Check CK level as patient is on a statin.  Thank you for the consult. I have discussed with the ER physician and ICU team.  Rosita Fire 02/07/2020, 8:56 PM  [facility].

## 2020-02-08 NOTE — Progress Notes (Signed)
eLink Physician-Brief Progress Note Patient Name: Jason Good DOB: Apr 19, 1951 MRN: [id number]   Date of Service  01/26/2020  HPI/Events of Note  CXR reviewed to confirm central line placement.  eICU Interventions  Okay to use central line.        [name] [name] 02/07/2020, 10:32 PM

## 2020-02-08 NOTE — Procedures (Signed)
Central Venous Catheter Insertion Procedure Note  Jason Good  [id number]  02/28/1951  Date:01/30/2020  Time:9:37 PM   Provider Performing:Khamryn Calderone Shearon Stalls   Procedure: Insertion of Non-tunneled Central Venous Catheter(36556) without US guidance  Indication(s) Medication administration and Difficult access  Consent Risks of the procedure as well as the alternatives and risks of each were explained to the patient and/or caregiver.  Consent for the procedure was obtained and is signed in the bedside chart  Anesthesia Topical only with 1% lidocaine   Timeout Verified patient identification, verified procedure, site/side was marked, verified correct patient position, special equipment/implants available, medications/allergies/relevant history reviewed, required imaging and test results available.  Sterile Technique Maximal sterile technique including full sterile barrier drape, hand hygiene, sterile gown, sterile gloves, mask, hair covering, sterile ultrasound probe cover (if used).  Procedure Description Area of catheter insertion was cleaned with chlorhexidine and draped in sterile fashion.  Without real-time ultrasound guidance a central venous catheter was placed into the left subclavian vein. Nonpulsatile blood flow and easy flushing noted in all ports.  The catheter was sutured in place and sterile dressing applied.  Complications/Tolerance None; patient tolerated the procedure well. Chest X-ray is ordered to verify placement for internal jugular or subclavian cannulation.   Chest x-ray is not ordered for femoral cannulation.  EBL Minimal  Specimen(s) None   Montey Hora, [place] [facility] 02/21/2020, 9:38 PM

## 2020-02-08 NOTE — H&P (Signed)
NAME:  Jason Good, MRN:  [id number], DOB:  08-15-1950, LOS: 0 ADMISSION DATE:  01/29/2020, CONSULTATION DATE:  01/31/2020 REFERRING MD:  Sabra Heck - [facility]  CHIEF COMPLAINT:  Weakness, dyspnea   Brief History   Jason Good is a 69 y.o. male who was admitted 9/16 with generalized weakness and found to have significant renal failure with life threatening hyperkalemia.  He was transferred to [facility] for emergent HD.  History of present illness   Jason Good is a 69 y.o. male who has a PMH including but not limited to HTN, HLD (see "past medical history" for rest).  He presented to [facility] ED 9/16 with weakness and dyspnea.  He was found to be bradycardic in the 30's and had SBP in 90's.    Workup revealed significant renal failure with life threatening hyperkalemia for which he received temporizing measures. Nephrology was consulted and recommended transfer to [facility] for emergent HD.  A trialysis catheter was placed in right femoral vein by EDP prior to transfer.  He stated that he had nausea and diarrhea for a week.  He did notice some dark stools 1 week ago which has since improved. Denies fevers/chills/sweats, headaches, chest pain, wheezing, abd pain, myalgias, exposures to known sick contacts.  Past Medical History  has HYPERLIPIDEMIA; ANEMIA-NOS; GLAUCOMA NOS; CATARACT NOS; HYPERTENSION; OSTEOARTHRITIS; LOW BACK PAIN; and ESRD (end stage renal disease) ([place]) on their problem list.  Significant Hospital Events   9/16 > admit.  Consults:  Nephrology.  Procedures:  R femoral HD cath 9/16 (EDP) >  CVL pending 9/16 >  Arterial line 9/16 >  Significant Diagnostic Tests:  CXR 9/16 > vascular congestion.  Micro Data:  COVID 9/16 > neg.  Antimicrobials:  None.   Interim history/subjective:  Slightly confused.  On 20 levo, 5 epi. CRRT pending.  Objective:  Blood pressure (!) 104/56, pulse (!) 57, temperature (!) 94 F (34.4 C), temperature source Rectal, resp. rate 18, height 5\' 6"   (1.676 m), weight 55.8 kg, SpO2 99 %.       No intake or output data in the 24 hours ending 02/11/2020 1957 Filed Weights   02/04/2020 1647  Weight: 55.8 kg    Examination: General: Adult male, in NAD. Neuro: Awake but slightly confused.  MAE's. HEENT: [place]/AT. Sclerae anicteric.  EOMI.  MM dry. Cardiovascular: RRR, no M/R/G.  Lungs: Respirations even and unlabored.  CTA bilaterally, No W/R/R.  Abdomen: BS x 4, slightly distended but soft.  Musculoskeletal: No gross deformities, no edema.  Skin: Intact, warm, no rashes.  Assessment & Plan:   Significant renal failure with life threatening hyperkalemia - s/p temporizing measures in ED.  Nephrology consulted and recommended emergent HD after transfer to [facility].  Does have both ACEi and NSAID on home med list. AGMA - presumed 2/2 renal failure. - Nephrology planning for CRRT tonight then will re-assess in AM. - 2amps HCO3 then HCO3 drip for tonight. - Frequent BMP now then q4hrs.  Hypocalcemia. - 1g Ca gluconate.  Hypotension - currently on levo + epi, presumed 2/2 acidosis. No hx to suggest infection / sepsis. - Continue levo as needed for goal MAP > 65. - Wean epi to off.  Anemia - unclear of baseline. No obvious bleeding but FOBT was positive. - Transfuse 1u PRBC tonight. - Consider GI consult pending course.  Protein calorie malnutrition (Albumin 1.7). - Nutrition consult once over acute issues.  Hx HTN, HLD. - Hold home amlodipine, ASA, labetalol, lisinopril, rosuvastatin.  Best Practice:  Diet: Heart healthy. Pain/Anxiety/Delirium protocol (if indicated): N/A. VAP protocol (if indicated): N/A. DVT prophylaxis: SCD's only. GI prophylaxis: N/A. Glucose control: SSI if glucose consistently > 180. Mobility: Bedrest. Code Status: Full. Family Communication: None available. Disposition: ICU.  Labs   CBC: Recent Labs  Lab 02/15/2020 1659 02/12/2020 1700 02/16/2020 1907  WBC  --  16.9*  --   NEUTROABS  --  13.3*  --     HGB 7.5* 7.4* 6.5*  HCT 22.0* 21.5* 19.0*  MCV  --  71.0*  --   PLT  --  403*  --    Basic Metabolic Panel: Recent Labs  Lab 02/02/2020 1659 02/03/2020 1700 02/03/2020 1904 02/07/2020 1907  NA 130* 133*  --  134*  K 7.0* 7.3*  --  5.9*  CL 103 99  --  104  CO2  --  10*  --   --   GLUCOSE 103* 109*  --  140*  BUN 84* 99*  --  80*  CREATININE 14.50* 13.10*  --  14.30*  CALCIUM  --  5.2*  --   --   MG  --  2.6*  --   --   PHOS  --   --  15.2*  --    GFR: Estimated Creatinine Clearance: 3.8 mL/min (A) (by C-G formula based on SCr of 14.3 mg/dL (H)). Recent Labs  Lab 02/14/2020 1700  WBC 16.9*   Liver Function Tests: Recent Labs  Lab 01/31/2020 1700  AST 370*  ALT 43  ALKPHOS 391*  BILITOT 3.6*  PROT 5.8*  ALBUMIN 1.7*   No results for input(s): LIPASE, AMYLASE in the last 168 hours. No results for input(s): AMMONIA in the last 168 hours. ABG    Component Value Date/Time   TCO2 13 (L) 02/22/2020 1907    Coagulation Profile: Recent Labs  Lab 02/04/2020 1700  INR 2.2*   Cardiac Enzymes: No results for input(s): CKTOTAL, CKMB, CKMBINDEX, TROPONINI in the last 168 hours. HbA1C: No results found for: HGBA1C CBG: Recent Labs  Lab 01/28/2020 1900  GLUCAP 151*    Review of Systems:   All negative; except for those that are bolded, which indicate positives.  Constitutional: weight loss, weight gain, night sweats, fevers, chills, fatigue, weakness.  HEENT: headaches, sore throat, sneezing, nasal congestion, post nasal drip, difficulty swallowing, tooth/dental problems, visual complaints, visual changes, ear aches. Neuro: difficulty with speech, weakness, numbness, ataxia. CV:  chest pain, orthopnea, PND, swelling in lower extremities, dizziness, palpitations, syncope.  Resp: cough, hemoptysis, dyspnea, wheezing. GI: heartburn, indigestion, abdominal pain, nausea, vomiting, diarrhea, constipation, change in bowel habits, loss of appetite, hematemesis, melena, hematochezia.   GU: dysuria, change in color of urine, urgency or frequency, flank pain, hematuria. MSK: joint pain or swelling, decreased range of motion. Psych: change in mood or affect, depression, anxiety, suicidal ideations, homicidal ideations. Skin: rash, itching, bruising.   Past medical history  He,  has a past medical history of Back pain, High cholesterol, and Hypertension.   Surgical History   No past surgical history on file.   Social History   reports that he has been smoking cigarettes. He does not have any smokeless tobacco history on file. He reports current alcohol use. He reports that he does not use drugs.   Family history   His family history is not on file.   Allergies No Known Allergies   Home meds  Prior to Admission medications   Medication Sig Start Date End Date  Taking? Authorizing Provider  amLODipine (NORVASC) 10 MG tablet Take 10 mg by mouth daily.    [provider]  aspirin EC 81 MG tablet Take 81 mg by mouth daily.    [provider]  labetalol (NORMODYNE) 200 MG tablet Take 200 mg by mouth 2 (two) times daily. 01/31/20   [provider]  lisinopril (ZESTRIL) 20 MG tablet Take 20 mg by mouth daily. 02/06/20   [provider]  naproxen (NAPROSYN) 500 MG tablet Take 500 mg by mouth 2 (two) times daily with a meal.    [provider]  oxyCODONE (OXY IR/ROXICODONE) 5 MG immediate release tablet Take 5 mg by mouth 3 (three) times daily. 01/31/20   [provider]  rosuvastatin (CRESTOR) 10 MG tablet Take 10 mg by mouth daily.    [provider]    Critical care time: 40 min.    Montey Hora, [place] [facility] 02/09/2020, 8:56 PM

## 2020-02-08 NOTE — ED Provider Notes (Signed)
[facility] EMERGENCY DEPARTMENT Provider Note   CSN: [id number] Arrival date & time: 02/01/2020  1630     History Chief Complaint  Patient presents with  . Bradycardia    30s on arrival to ed    Jason Good is a 69 y.o. male.  HPI   This patient is a 69 year old male, history of hypertension and high cholesterol, the patient is not aware of the medications that he is taking but states he takes medicines for these 2 conditions.  The call went out for shortness of breath.  He presents to the hospital today with shortness of breath and severe weakness, he was found to be bradycardic with a heart rate in the 30s as well as blood pressure in the 90s.  The patient is unable to give much more information, level 5 caveat applies secondary to the degree of illness.  Does endorse having some fevers potentially, denies any history of kidney failure  Past Medical History:  Diagnosis Date  . Back pain   . High cholesterol   . Hypertension     Patient Active Problem List   Diagnosis Date Noted  . HYPERLIPIDEMIA 05/08/2006  . ANEMIA-NOS 05/08/2006  . GLAUCOMA NOS 05/08/2006  . CATARACT NOS 05/08/2006  . HYPERTENSION 05/08/2006  . OSTEOARTHRITIS 05/08/2006  . LOW BACK PAIN 05/08/2006    No past surgical history on file.     No family history on file.  Social History   Tobacco Use  . Smoking status: Current Every Day Smoker    Types: Cigarettes  Substance Use Topics  . Alcohol use: Yes    Comment: occasional  . Drug use: No    Home Medications Prior to Admission medications   Medication Sig Start Date End Date Taking? Authorizing Provider  amLODipine (NORVASC) 10 MG tablet Take 10 mg by mouth daily.    [provider]  aspirin EC 81 MG tablet Take 81 mg by mouth daily.    [provider]  labetalol (NORMODYNE) 200 MG tablet Take 200 mg by mouth 2 (two) times daily. 01/31/20   [provider]  lisinopril (ZESTRIL) 20 MG tablet Take 20 mg by  mouth daily. 02/06/20   [provider]  naproxen (NAPROSYN) 500 MG tablet Take 500 mg by mouth 2 (two) times daily with a meal.    [provider]  oxyCODONE (OXY IR/ROXICODONE) 5 MG immediate release tablet Take 5 mg by mouth 3 (three) times daily. 01/31/20   [provider]  rosuvastatin (CRESTOR) 10 MG tablet Take 10 mg by mouth daily.    [provider]    Allergies    Patient has no known allergies.  Review of Systems   Review of Systems  Unable to perform ROS: Acuity of condition    Physical Exam Updated Vital Signs Pulse (!) 53   Physical Exam Vitals and nursing note reviewed.  Constitutional:      General: He is in acute distress.     Appearance: He is well-developed. He is ill-appearing and toxic-appearing.     Comments: somnolent  HENT:     Head: Normocephalic and atraumatic.     Nose: Nose normal.     Mouth/Throat:     Mouth: Mucous membranes are dry.     Pharynx: No oropharyngeal exudate.  Eyes:     General: No scleral icterus.       Right eye: No discharge.        Left eye: No discharge.  Conjunctiva/sclera: Conjunctivae normal.     Pupils: Pupils are equal, round, and reactive to light.  Neck:     Thyroid: No thyromegaly.     Vascular: No JVD.  Cardiovascular:     Rate and Rhythm: Regular rhythm. Bradycardia present.     Heart sounds: Normal heart sounds. No murmur heard.  No friction rub. No gallop.   Pulmonary:     Effort: Pulmonary effort is normal. No respiratory distress.     Breath sounds: Normal breath sounds. No wheezing or rales.  Abdominal:     General: Bowel sounds are normal. There is no distension.     Palpations: Abdomen is soft. There is no mass.     Tenderness: There is no abdominal tenderness.  Musculoskeletal:        General: No tenderness. Normal range of motion.     Cervical back: Normal range of motion and neck supple.     Right lower leg: No edema.     Left lower leg: No edema.    Lymphadenopathy:     Cervical: No cervical adenopathy.  Skin:    General: Skin is warm and dry.     Coloration: Skin is pale.     Findings: No erythema or rash.  Neurological:     Coordination: Coordination normal.     Comments: Severely weak, he can move all 4 extremities but barely move by himself, he cannot sit up without significant assistance, no obvious facial droop, voice is weak  Psychiatric:        Behavior: Behavior normal.     ED Results / Procedures / Treatments   Labs (all labs ordered are listed, but only abnormal results are displayed) Labs Reviewed  CBC WITH DIFFERENTIAL/PLATELET - Abnormal; Notable for the following components:      Result Value   WBC 16.9 (*)    RBC 3.03 (*)    Hemoglobin 7.4 (*)    HCT 21.5 (*)    MCV 71.0 (*)    MCH 24.4 (*)    Platelets 403 (*)    All other components within normal limits  PROTIME-INR - Abnormal; Notable for the following components:   Prothrombin Time 23.3 (*)    INR 2.2 (*)    All other components within normal limits  I-STAT CHEM 8, ED - Abnormal; Notable for the following components:   Sodium 130 (*)    Potassium 7.0 (*)    BUN 84 (*)    Creatinine, Ser 14.50 (*)    Glucose, Bld 103 (*)    Calcium, Ion 0.62 (*)    TCO2 11 (*)    Hemoglobin 7.5 (*)    HCT 22.0 (*)    All other components within normal limits  SARS CORONAVIRUS 2 BY RT PCR (HOSPITAL ORDER, [place])  COMPREHENSIVE METABOLIC PANEL  URINALYSIS, ROUTINE W REFLEX MICROSCOPIC  MAGNESIUM  RENAL FUNCTION PANEL  POC OCCULT BLOOD, ED  TYPE AND SCREEN  TROPONIN I (HIGH SENSITIVITY)  TROPONIN I (HIGH SENSITIVITY)     EKG EKG Interpretation  Date/Time:  Thursday February 08 2020 16:44:12 EDT Ventricular Rate:  57 PR Interval:    QRS Duration: 161 QT Interval:  587 QTC Calculation: 572 R Axis:   89 Text Interpretation: Sinus bradycardia First degree A-V block Right bundle branch block Prolonged QT no old EKG  abnormal. Confirmed by Noemi Chapel [phone]) on 02/01/2020 4:49:06 PM   EKG Interpretation  Date/Time:  Thursday February 08 2020 18:30:57 EDT  Ventricular Rate:  61 PR Interval:    QRS Duration: 143 QT Interval:  523 QTC Calculation: 527 R Axis:   81 Text Interpretation: Incomplete analysis due to missing data in precordial lead(s) Sinus rhythm Nonspecific intraventricular conduction delay Missing lead(s): V5 T waves less prominent compared wtih prior Confirmed by Noemi Chapel [phone]) on 01/25/2020 6:39:48 PM        Radiology DG Chest Port 1 View  Result Date: 02/20/2020 CLINICAL DATA:  Weakness and shortness of breath EXAM: PORTABLE CHEST 1 VIEW COMPARISON:  September 17, 2007 FINDINGS: The heart size and mediastinal contours are mildly enlarged. Aortic knob calcifications are seen. There is prominence of the central pulmonary vasculature. No acute osseous abnormality. IMPRESSION: Mild cardiomegaly and pulmonary vascular congestion. Electronically Signed   By: Prudencio Pair M.D.   On: 02/16/2020 17:35    Procedures .Critical Care Performed by: Noemi Chapel, MD Authorized by: Noemi Chapel, MD   Critical care provider statement:    Critical care time (minutes):  35   Critical care time was exclusive of:  Separately billable procedures and treating other patients and teaching time   Critical care was necessary to treat or prevent imminent or life-threatening deterioration of the following conditions:  Renal failure   Critical care was time spent personally by me on the following activities:  Blood draw for specimens, development of treatment plan with patient or surrogate, discussions with consultants, evaluation of patient's response to treatment, examination of patient, obtaining history from patient or surrogate, ordering and performing treatments and interventions, ordering and review of laboratory studies, ordering and review of radiographic studies, pulse oximetry, re-evaluation of  patient's condition and review of old charts  .Central Line  Date/Time: 02/19/2020 6:26 PM Performed by: Noemi Chapel, MD Authorized by: Noemi Chapel, MD   Consent:    Consent obtained:  Verbal   Consent given by:  Patient   Risks discussed:  Arterial puncture, incorrect placement, bleeding, infection and nerve damage   Alternatives discussed:  Alternative treatment Universal protocol:    Procedure explained and questions answered to patient or proxy's satisfaction: yes     Relevant documents present and verified: yes     Test results available and properly labeled: yes     Imaging studies available: yes     Required blood products, implants, devices, and special equipment available: yes     Site/side marked: yes     Immediately prior to procedure, a time out was called: yes     Patient identity confirmed:  Verbally with patient Pre-procedure details:    Hand hygiene: Hand hygiene performed prior to insertion     Sterile barrier technique: All elements of maximal sterile technique followed     Skin preparation:  ChloraPrep   Skin preparation agent: Skin preparation agent completely dried prior to procedure   Anesthesia (see MAR for exact dosages):    Anesthesia method:  Local infiltration   Local anesthetic:  Lidocaine 1% w/o epi Procedure details:    Location:  R femoral   Patient position:  Flat   Procedural supplies:  Double lumen   Catheter size:  12 Fr   Landmarks identified: yes     Ultrasound guidance: no     Number of attempts:  1   Successful placement: yes   Post-procedure details:    Post-procedure:  Dressing applied and line sutured   Assessment:  Blood return through all ports and free fluid flow   Patient tolerance of procedure:  Tolerated well,  no immediate complications Comments:         (including critical care time)  Medications Ordered in ED Medications  0.9 %  sodium chloride infusion (0 mLs Intravenous Hold 02/09/2020 1700)  sodium bicarbonate  100 mEq in dextrose 5 % 1,000 mL infusion (has no administration in time range)  furosemide (LASIX) tablet 80 mg (80 mg Oral Not Given 02/21/2020 1803)  furosemide (LASIX) injection 80 mg (has no administration in time range)  sodium chloride 0.9 % bolus 1,000 mL (0 mLs Intravenous Stopped 02/17/2020 1814)  magnesium sulfate IVPB 2 g 50 mL (0 g Intravenous Stopped 02/12/2020 1814)  calcium gluconate inj 10% (1 g) URGENT USE ONLY! (1 g Intravenous Given 02/09/2020 1720)  insulin aspart (novoLOG) injection 5 Units (5 Units Intravenous Given 02/01/2020 1722)    And  dextrose 50 % solution 50 mL (50 mLs Intravenous Given 02/14/2020 1720)  sodium bicarbonate injection 50 mEq (50 mEq Intravenous Given 01/31/2020 1728)  sodium zirconium cyclosilicate (LOKELMA) packet 10 g (10 g Oral Given 02/09/2020 1722)  sodium chloride 0.9 % bolus 1,000 mL (1,000 mLs Intravenous New Bag/Given 02/17/2020 1758)    ED Course  I have reviewed the triage vital signs and the nursing notes.  Pertinent labs & imaging results that were available during my care of the patient were reviewed by me and considered in my medical decision making (see chart for details).  Clinical Course as of Feb 08 1744  Thu Feb 08, 2020  1704 My bedside ultrasound shows that this patient has decreased ejection fraction, appears to have a slightly dilated heart, no obvious effusion.   [BM]  1704 I-STAT chemistry shows a potassium of 7.0, the patient is hypothermic, he is actively being treated for hyperkalemia with bicarbonate, insulin and D50 and being actively warmed with a bear hugger.   [BM]  1713 EKG correlates with the potassium finding of 7.0, creatinine is 14.5 and his BUN is 84.  The patient's hemoglobin is 7.5 also consistent with the patient's anemic appearance.  We will consult with nephrology for potential emergent dialysis   [BM]    Clinical Course User Index [BM] Noemi Chapel, MD   MDM Rules/Calculators/A&P                          This patient  is critically ill, he has bradycardic, he has signs of hyperkalemia on his EKG.  The biggest concern here is a metabolic issue.  I would not be surprised if he was in renal failure and severely anemic based on appearance.  Labs pending, IV fluids will be given, chest x-ray will be obtained  Discussed the case with Dr. Carolin Sicks of the nephrology service who recommends another liter of normal saline, agrees with all the hyperkalemia medications and requests 80 mg of Lasix in addition to help lower the potassium level.  He agrees that the patient needs emergent dialysis and recommends ICU.  If ICU is not available the patient will need ED to ED transfer to [facility]  The dialysis catheter was placed successfully without complications on the first attempt in the right femoral vein, this was sutured and placed and flushed, sterile procedure, sterile conditions were maintained throughout the procedure, sterile dressing placed.  Intensive care unit physician Dr. Wende Crease has accepted care of the patient to the intensive care unit.  He is critically ill.  His EKG has improved on repeat EKG  Final Clinical Impression(s) / ED Diagnoses  Final diagnoses:  Hyperkalemia  Acute renal failure, unspecified acute renal failure type ([place])  Hypothermia, initial encounter      Noemi Chapel, MD 01/27/2020 1840

## 2020-02-08 NOTE — ED Triage Notes (Signed)
EMS called for respiratory distress. Is having SOB as well as weakness. Denies any other symptoms. HR with EMS has jumped from 50's to 30's. BP systolic is 90. 14X right AC.

## 2020-02-08 NOTE — Procedures (Signed)
Arterial Catheter Insertion Procedure Note  Jason Good  [id number]  [birth year]  Date:02/17/2020  Time:9:38 PM    Provider Performing: Montey Hora    Procedure: Insertion of Arterial Line [id number]) without US guidance  Indication(s) Blood pressure monitoring and/or need for frequent ABGs  Consent Risks of the procedure as well as the alternatives and risks of each were explained to the patient and/or caregiver.  Consent for the procedure was obtained and is signed in the bedside chart  Anesthesia None   Time Out Verified patient identification, verified procedure, site/side was marked, verified correct patient position, special equipment/implants available, medications/allergies/relevant history reviewed, required imaging and test results available.   Sterile Technique Maximal sterile technique including full sterile barrier drape, hand hygiene, sterile gown, sterile gloves, mask, hair covering, sterile ultrasound probe cover (if used).   Procedure Description Area of catheter insertion was cleaned with chlorhexidine and draped in sterile fashion. Without real-time ultrasound guidance an arterial catheter was placed into the left radial artery.  Appropriate arterial tracings confirmed on monitor.     Complications/Tolerance None; patient tolerated the procedure well.   EBL Minimal   Specimen(s) None   Montey Hora, [place] [facility] 01/26/2020, 9:38 PM

## 2020-02-08 NOTE — ED Notes (Signed)
CRITICAL VALUE ALERT  Critical Value:  K 7.3 Ca 5.2   Date & Time Notied:  02/21/2020 1831  Provider Notified: Dr. Sabra Heck   Orders Received/Actions taken: None yet

## 2020-02-08 NOTE — Progress Notes (Signed)
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 Hemoglobin is 6.5 gm.  eICU Interventions  New patient evaluation completed, PCCM ground crew asked to see patient stat for work up and management of patient. They are in the room currently.        [name] [name] 02/22/2020, 8:31 PM

## 2020-02-09 ENCOUNTER — Inpatient Hospital Stay (HOSPITAL_COMMUNITY): Payer: Medicare Other

## 2020-02-09 ENCOUNTER — Encounter (HOSPITAL_COMMUNITY): Payer: Self-pay | Admitting: Pulmonary Disease

## 2020-02-09 DIAGNOSIS — E875 Hyperkalemia: Secondary | ICD-10-CM

## 2020-02-09 DIAGNOSIS — I313 Pericardial effusion (noninflammatory): Secondary | ICD-10-CM

## 2020-02-09 DIAGNOSIS — N170 Acute kidney failure with tubular necrosis: Secondary | ICD-10-CM

## 2020-02-09 LAB — ECHOCARDIOGRAM COMPLETE
AR max vel: 2.81 cm2
AV Area VTI: 2.83 cm2
AV Area mean vel: 2.84 cm2
AV Mean grad: 3 mmHg
AV Peak grad: 8.8 mmHg
Ao pk vel: 1.48 m/s
Area-P 1/2: 3.17 cm2
Height: 66 in
P 1/2 time: 759 msec
S' Lateral: 2.7 cm
Weight: 2000.01 oz

## 2020-02-09 LAB — CBC
HCT: 17.1 % — ABNORMAL LOW (ref 39.0–52.0)
Hemoglobin: 6 g/dL — CL (ref 13.0–17.0)
MCH: 23.7 pg — ABNORMAL LOW (ref 26.0–34.0)
MCHC: 35.1 g/dL (ref 30.0–36.0)
MCV: 67.6 fL — ABNORMAL LOW (ref 80.0–100.0)
Platelets: 389 10*3/uL (ref 150–400)
RBC: 2.53 MIL/uL — ABNORMAL LOW (ref 4.22–5.81)
RDW: 14.3 % (ref 11.5–15.5)
WBC: 17.4 10*3/uL — ABNORMAL HIGH (ref 4.0–10.5)
nRBC: 0.1 % (ref 0.0–0.2)

## 2020-02-09 LAB — HEMOGLOBIN AND HEMATOCRIT, BLOOD
HCT: 27.1 % — ABNORMAL LOW (ref 39.0–52.0)
Hemoglobin: 9.5 g/dL — ABNORMAL LOW (ref 13.0–17.0)

## 2020-02-09 LAB — RENAL FUNCTION PANEL
Albumin: 1.3 g/dL — ABNORMAL LOW (ref 3.5–5.0)
Albumin: 1.4 g/dL — ABNORMAL LOW (ref 3.5–5.0)
Anion gap: 20 — ABNORMAL HIGH (ref 5–15)
Anion gap: 19 — ABNORMAL HIGH (ref 5–15)
BUN: 79 mg/dL — ABNORMAL HIGH (ref 8–23)
BUN: 42 mg/dL — ABNORMAL HIGH (ref 8–23)
CO2: 13 mmol/L — ABNORMAL LOW (ref 22–32)
CO2: 16 mmol/L — ABNORMAL LOW (ref 22–32)
Calcium: 5.2 mg/dL — CL (ref 8.9–10.3)
Calcium: 6.1 mg/dL — CL (ref 8.9–10.3)
Chloride: 102 mmol/L (ref 98–111)
Chloride: 102 mmol/L (ref 98–111)
Creatinine, Ser: 9.25 mg/dL — ABNORMAL HIGH (ref 0.61–1.24)
Creatinine, Ser: 5.36 mg/dL — ABNORMAL HIGH (ref 0.61–1.24)
GFR calc Af Amer: 6 mL/min — ABNORMAL LOW (ref >60)
GFR calc Af Amer: 12 mL/min — ABNORMAL LOW (ref >60)
GFR calc non Af Amer: 5 mL/min — ABNORMAL LOW (ref >60)
GFR calc non Af Amer: 10 mL/min — ABNORMAL LOW (ref >60)
Glucose, Bld: 140 mg/dL — ABNORMAL HIGH (ref 70–99)
Glucose, Bld: 95 mg/dL (ref 70–99)
Phosphorus: 11.1 mg/dL — ABNORMAL HIGH (ref 2.5–4.6)
Phosphorus: 7.5 mg/dL — ABNORMAL HIGH (ref 2.5–4.6)
Potassium: 5.8 mmol/L — ABNORMAL HIGH (ref 3.5–5.1)
Potassium: 4.6 mmol/L (ref 3.5–5.1)
Sodium: 135 mmol/L (ref 135–145)
Sodium: 137 mmol/L (ref 135–145)

## 2020-02-09 LAB — URINALYSIS, MICROSCOPIC (REFLEX)
RBC / HPF: >50 RBC/hpf (ref 0–5)
WBC, UA: NONE SEEN WBC/hpf (ref 0–5)

## 2020-02-09 LAB — PROTIME-INR
INR: 2.3 — ABNORMAL HIGH (ref 0.8–1.2)
Prothrombin Time: 24.1 seconds — ABNORMAL HIGH (ref 11.4–15.2)

## 2020-02-09 LAB — URINALYSIS, ROUTINE W REFLEX MICROSCOPIC

## 2020-02-09 LAB — IRON AND TIBC
Iron: 72 ug/dL (ref 45–182)
Saturation Ratios: 49 % — ABNORMAL HIGH (ref 17.9–39.5)
TIBC: 148 ug/dL — ABNORMAL LOW (ref 250–450)
UIBC: 76 ug/dL

## 2020-02-09 LAB — APTT: aPTT: 76 seconds — ABNORMAL HIGH (ref 24–36)

## 2020-02-09 LAB — PREPARE RBC (CROSSMATCH)

## 2020-02-09 LAB — PROTEIN / CREATININE RATIO, URINE
Creatinine, Urine: 13.35 mg/dL
Total Protein, Urine: UNDETERMINED mg/dL

## 2020-02-09 LAB — SODIUM, URINE, RANDOM: Sodium, Ur: 134 mmol/L

## 2020-02-09 LAB — PSA: Prostatic Specific Antigen: 0.13 ng/mL (ref 0.00–4.00)

## 2020-02-09 LAB — HIV ANTIBODY (ROUTINE TESTING W REFLEX): HIV Screen 4th Generation wRfx: NONREACTIVE

## 2020-02-09 LAB — MAGNESIUM: Magnesium: 2.7 mg/dL — ABNORMAL HIGH (ref 1.7–2.4)

## 2020-02-09 LAB — FERRITIN: Ferritin: 6879 ng/mL — ABNORMAL HIGH (ref 24–336)

## 2020-02-09 LAB — LACTIC ACID, PLASMA: Lactic Acid, Venous: 2.4 mmol/L (ref 0.5–1.9)

## 2020-02-09 MED ORDER — SODIUM CHLORIDE 0.9 % IV BOLUS
500.0000 mL | Freq: Once | INTRAVENOUS | Status: AC
  Administered 2020-02-09: 500 mL via INTRAVENOUS

## 2020-02-09 MED ORDER — CALCIUM GLUCONATE-NACL 1-0.675 GM/50ML-% IV SOLN
1.0000 g | Freq: Once | INTRAVENOUS | Status: AC
  Administered 2020-02-09: 1000 mg via INTRAVENOUS
  Filled 2020-02-09: qty 50

## 2020-02-09 MED ORDER — CHLORHEXIDINE GLUCONATE CLOTH 2 % EX PADS: 6.0000 | MEDICATED_PAD | Freq: Every day | CUTANEOUS | Status: DC

## 2020-02-09 MED ORDER — CALCIUM GLUCONATE-NACL 1-0.675 GM/50ML-% IV SOLN: 1.0000 g | Freq: Once | INTRAVENOUS | Status: DC

## 2020-02-09 MED ORDER — SODIUM CHLORIDE 0.9% FLUSH: 10.0000 mL | INTRAVENOUS | Status: DC | PRN

## 2020-02-09 MED ORDER — IOHEXOL 9 MG/ML PO SOLN: 500.0000 mL | ORAL | Status: AC

## 2020-02-09 MED ORDER — SODIUM CHLORIDE 0.9 % IR SOLN
3000.0000 mL | Status: DC
  Administered 2020-02-09 – 2020-02-10 (×7): 3000 mL

## 2020-02-09 MED ORDER — FENTANYL CITRATE (PF) 100 MCG/2ML IJ SOLN
25.0000 ug | INTRAMUSCULAR | Status: DC | PRN
  Administered 2020-02-09 (×3): 25 ug via INTRAVENOUS
  Filled 2020-02-09 (×3): qty 2

## 2020-02-09 MED ORDER — OXYCODONE HCL 5 MG PO TABS: 5.0000 mg | ORAL_TABLET | ORAL | Status: DC | PRN

## 2020-02-09 MED ORDER — SODIUM CHLORIDE 0.9% FLUSH
10.0000 mL | Freq: Two times a day (BID) | INTRAVENOUS | Status: DC
  Administered 2020-02-09 – 2020-02-10 (×3): 10 mL

## 2020-02-09 MED ORDER — SODIUM CHLORIDE 0.9% IV SOLUTION: Freq: Once | INTRAVENOUS | Status: DC

## 2020-02-09 MED ORDER — CALCIUM GLUCONATE-NACL 2-0.675 GM/100ML-% IV SOLN
2.0000 g | Freq: Once | INTRAVENOUS | Status: AC
  Administered 2020-02-09: 2000 mg via INTRAVENOUS
  Filled 2020-02-09: qty 100

## 2020-02-09 MED ORDER — SODIUM CHLORIDE 0.9 % IV SOLN
2.0000 g | Freq: Two times a day (BID) | INTRAVENOUS | Status: DC
  Administered 2020-02-09 – 2020-02-10 (×3): 2 g via INTRAVENOUS
  Filled 2020-02-09 (×3): qty 2

## 2020-02-09 NOTE — Consult Note (Addendum)
Reason for Consult: Acute Renal Failure, Gross Hematuria, Prostate Screening,   Referring Physician: Rhys Martini MD  Jason Good is an 69 y.o. male.   HPI:   1 - Acute Renal Failure - Cr 13 up from 1.2 2008 on eval weakness / malaise. Renal US and CT withotu hydro or significant parenchymal thinning. Also hypotensive with high PCT c/w likelyi sepsis.   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 No large GU masses by CT. H/o pelvic radiation.   3 - Prostate Cancer - s/p external beam radiation for unknown grade / stage disease. Prostate radiation markers present by Ct 01/2020. PSA 0.13 c/w excellent disease control.   4 - Liver Lesions / Likely Metastatic Cancer - diffuse hepatic lesions by CT 01/2020. CT chest OK. No pelvic adenopathy.  Distribution most c/w GI v. hepatic primary.   PMH unknown.   Today " Jason Good" is seen in consultation or gross hematuria.   Past Medical History:  Diagnosis Date  . Back pain   . High cholesterol   . Hypertension     No past surgical history on file.  No family history on file.  Social History:  reports that he has been smoking cigarettes. He does not have any smokeless tobacco history on file. He reports current alcohol use. He reports that he does not use drugs.  Allergies: No Known Allergies  Medications: I have reviewed the patient's current medications.  Results for orders placed or performed during the hospital encounter of 01/24/2020 (from the past 48 hour(s))  I-stat chem 8, ED (not at [facility] or [facility])     Status: Abnormal   Collection Time: 02/05/2020  4:59 PM  Result Value Ref Range   Sodium 130 (L) 135 - 145 mmol/L   Potassium 7.0 (HH) 3.5 - 5.1 mmol/L   Chloride 103 98 - 111 mmol/L   BUN 84 (H) 8 - 23 mg/dL   Creatinine, Ser 14.50 (H) 0.61 - 1.24 mg/dL   Glucose, Bld 103 (H) 70 - 99 mg/dL    Comment: Glucose reference range  applies only to samples taken after fasting for at least 8 hours.   Calcium, Ion 0.62 (LL) 1.15 - 1.40 mmol/L   TCO2 11 (L) 22 - 32 mmol/L   Hemoglobin 7.5 (L) 13.0 - 17.0 g/dL   HCT 22.0 (L) 39 - 52 %   Comment NOTIFIED PHYSICIAN   CBC with Differential/Platelet     Status: Abnormal   Collection Time: 01/26/2020  5:00 PM  Result Value Ref Range   WBC 16.9 (H) 4.0 - 10.5 K/uL   RBC 3.03 (L) 4.22 - 5.81 MIL/uL   Hemoglobin 7.4 (L) 13.0 - 17.0 g/dL    Comment: Reticulocyte Hemoglobin testing may be clinically indicated, consider ordering this additional test [id number]    HCT 21.5 (L) 39 - 52 %   MCV 71.0 (L) 80.0 - 100.0 fL   MCH 24.4 (L) 26.0 - 34.0 pg   MCHC 34.4 30.0 - 36.0 g/dL   RDW 15.2 11.5 - 15.5 %   Platelets 403 (H) 150 - 400 K/uL   nRBC 0.0 0.0 - 0.2 %   Neutrophils Relative % 79 %   Neutro Abs 13.3 (H) 1.7 - 7.7 K/uL   Lymphocytes Relative 9 %   Lymphs Abs 1.5 0.7 - 4.0 K/uL   Monocytes Relative 11 %   Monocytes Absolute 1.9 (H) 0 - 1 K/uL  Eosinophils Relative 0 %   Eosinophils Absolute 0.0 0 - 0 K/uL   Basophils Relative 0 %   Basophils Absolute 0.0 0 - 0 K/uL   WBC Morphology MILD LEFT SHIFT (1-5% METAS, OCC MYELO, OCC BANDS)     Comment: TOXIC GRANULATION VACUOLATED NEUTROPHILS BANDS PRESENT    Immature Granulocytes 1 %   Abs Immature Granulocytes 0.22 (H) 0.00 - 0.07 K/uL   Burr Cells PRESENT    Polychromasia PRESENT    Target Cells PRESENT     Comment: Performed at [facility], [address], [place], [place] [address]  Comprehensive metabolic panel     Status: Abnormal   Collection Time: 02/01/2020  5:00 PM  Result Value Ref Range   Sodium 133 (L) 135 - 145 mmol/L   Potassium 7.3 (HH) 3.5 - 5.1 mmol/L    Comment: CRITICAL RESULT CALLED TO, READ BACK BY AND VERIFIED WITH: WILEY,E ON 02/22/2020 AT 1830 BY LOY,C    Chloride 99 98 - 111 mmol/L   CO2 10 (L) 22 - 32 mmol/L   Glucose, Bld 109 (H) 70 - 99 mg/dL    Comment: Glucose reference range applies  only to samples taken after fasting for at least 8 hours.   BUN 99 (H) 8 - 23 mg/dL   Creatinine, Ser 13.10 (H) 0.61 - 1.24 mg/dL   Calcium 5.2 (LL) 8.9 - 10.3 mg/dL    Comment: CRITICAL RESULT CALLED TO, READ BACK BY AND VERIFIED WITH: WILEY,E ON 02/11/2020 AT 1830 BY LOY,C    Total Protein 5.8 (L) 6.5 - 8.1 g/dL   Albumin 1.7 (L) 3.5 - 5.0 g/dL   AST 370 (H) 15 - 41 U/L   ALT 43 0 - 44 U/L   Alkaline Phosphatase 391 (H) 38 - 126 U/L   Total Bilirubin 3.6 (H) 0.3 - 1.2 mg/dL   GFR calc non Af Amer 3 (L) >60 mL/min   GFR calc Af Amer 4 (L) >60 mL/min   Anion gap >20 (H) 5 - 15    Comment: Performed at [facility], [address] [place], [place] [address]  Troponin I (High Sensitivity)     Status: Abnormal   Collection Time: 02/13/2020  5:00 PM  Result Value Ref Range   Troponin I (High Sensitivity) 89 (H) <18 ng/L    Comment: (NOTE) Elevated high sensitivity troponin I (hsTnI) values and significant  changes across serial measurements may suggest ACS but many other  chronic and acute conditions are known to elevate hsTnI results.  Refer to the Links section for chest pain algorithms and additional  guidance. Performed at [facility], [address], [place], [place] [address]   Protime-INR     Status: Abnormal   Collection Time: 01/27/2020  5:00 PM  Result Value Ref Range   Prothrombin Time 23.3 (H) 11.4 - 15.2 seconds   INR 2.2 (H) 0.8 - 1.2    Comment: (NOTE) INR goal varies based on device and disease states. Performed at [facility], [address], [place], [place] [address]   Magnesium     Status: Abnormal   Collection Time: 02/04/2020  5:00 PM  Result Value Ref Range   Magnesium 2.6 (H) 1.7 - 2.4 mg/dL    Comment: Performed at [facility], [address], [place], [place] [address]  SARS Coronavirus 2 by RT PCR (hospital order, performed in [facility] hospital lab) Nasopharyngeal Nasopharyngeal Swab     Status: None   Collection Time: 02/13/2020  5:07  PM   Specimen:  Nasopharyngeal Swab  Result Value Ref Range   SARS Coronavirus 2 NEGATIVE NEGATIVE    Comment: (NOTE) SARS-CoV-2 target nucleic acids are NOT DETECTED.  The SARS-CoV-2 RNA is generally detectable in upper and lower respiratory specimens during the acute phase of infection. The lowest concentration of SARS-CoV-2 viral copies this assay can detect is 250 copies / mL. A negative result does not preclude SARS-CoV-2 infection and should not be used as the sole basis for treatment or other patient management decisions.  A negative result may occur with improper specimen collection / handling, submission of specimen other than nasopharyngeal swab, presence of viral mutation(s) within the areas targeted by this assay, and inadequate number of viral copies (<250 copies / mL). A negative result must be combined with clinical observations, patient history, and epidemiological information.  Fact Sheet for Patients:   StrictlyIdeas.no  Fact Sheet for Healthcare Providers: BankingDealers.co.za  This test is not yet approved or  cleared by the [place] FDA and has been authorized for detection and/or diagnosis of SARS-CoV-2 by FDA under an Emergency Use Authorization (EUA).  This EUA will remain in effect (meaning this test can be used) for the duration of the COVID-19 declaration under Section 564(b)(1) of the Act, 21 U.S.C. section 360bbb-3(b)(1), unless the authorization is terminated or revoked sooner.  Performed at [facility], [address], [place], [place] [address]   Type and screen     Status: None   Collection Time: 02/07/2020  5:28 PM  Result Value Ref Range   ABO/RH(D) A POS    Antibody Screen NEG    Sample Expiration      02/11/2020,2359 Performed at [facility], [address], [place], [place] [address]   POC occult blood, ED RN will collect     Status: Abnormal   Collection Time: 02/18/2020  6:53 PM  Result Value Ref Range    Fecal Occult Bld POSITIVE (A) NEGATIVE  CBG monitoring, ED     Status: Abnormal   Collection Time: 02/15/2020  7:00 PM  Result Value Ref Range   Glucose-Capillary 151 (H) 70 - 99 mg/dL    Comment: Glucose reference range applies only to samples taken after fasting for at least 8 hours.  Troponin I (High Sensitivity)     Status: Abnormal   Collection Time: 02/01/2020  7:04 PM  Result Value Ref Range   Troponin I (High Sensitivity) 85 (H) <18 ng/L    Comment: (NOTE) Elevated high sensitivity troponin I (hsTnI) values and significant  changes across serial measurements may suggest ACS but many other  chronic and acute conditions are known to elevate hsTnI results.  Refer to the "Links" section for chest pain algorithms and additional  guidance. Performed at [facility], [address], [place], [place] [address]   Phosphorus     Status: Abnormal   Collection Time: 01/30/2020  7:04 PM  Result Value Ref Range   Phosphorus 15.2 (H) 2.5 - 4.6 mg/dL    Comment: RESULTS CONFIRMED BY MANUAL DILUTION Performed at [facility], [address] [place], [place] [address]   I-stat chem 8, ed     Status: Abnormal   Collection Time: 02/13/2020  7:07 PM  Result Value Ref Range   Sodium 134 (L) 135 - 145 mmol/L   Potassium 5.9 (H) 3.5 - 5.1 mmol/L   Chloride 104 98 - 111 mmol/L   BUN 80 (H) 8 - 23 mg/dL   Creatinine, Ser 14.30 (H) 0.61 -  1.24 mg/dL   Glucose, Bld 140 (H) 70 - 99 mg/dL    Comment: Glucose reference range applies only to samples taken after fasting for at least 8 hours.   Calcium, Ion 0.64 (LL) 1.15 - 1.40 mmol/L   TCO2 13 (L) 22 - 32 mmol/L   Hemoglobin 6.5 (LL) 13.0 - 17.0 g/dL   HCT 19.0 (L) 39 - 52 %  Glucose, capillary     Status: Abnormal   Collection Time: 01/25/2020  8:50 PM  Result Value Ref Range   Glucose-Capillary 127 (H) 70 - 99 mg/dL    Comment: Glucose reference range applies only to samples taken after fasting for at least 8 hours.  CK     Status: Abnormal    Collection Time: 02/16/2020 10:05 PM  Result Value Ref Range   Total CK 2,982 (H) 49.0 - 397.0 U/L    Comment: Performed at [facility], [place] [address] [place], [place] [address]  Type and screen [place]     Status: None (Preliminary result)   Collection Time: 02/16/2020 10:05 PM  Result Value Ref Range   ABO/RH(D) A POS    Antibody Screen NEG    Sample Expiration 02/11/2020,2359    Unit Number [id number]    Blood Component Type RED CELLS,LR    Unit division 00    Status of Unit ISSUED    Transfusion Status OK TO TRANSFUSE    Crossmatch Result Compatible    Unit Number [id number]    Blood Component Type RED CELLS,LR    Unit division 00    Status of Unit ISSUED    Transfusion Status OK TO TRANSFUSE    Crossmatch Result      Compatible Performed at [facility], [place] [address] [place], [place] [address]   Lactic acid, plasma     Status: Abnormal   Collection Time: 02/07/2020 10:05 PM  Result Value Ref Range   Lactic Acid, Venous 2.0 (HH) 0.5 - 1.9 mmol/L    Comment: CRITICAL RESULT CALLED TO, READ BACK BY AND VERIFIED WITH: Keturah Barre RN [phone] [address] Radon Performed at [facility], [address] [place], [place] [address]   Procalcitonin     Status: None   Collection Time: 01/24/2020 10:05 PM  Result Value Ref Range   Procalcitonin 26.82 ng/mL    Comment:        Interpretation: PCT >= 10 ng/mL: Important systemic inflammatory response, almost exclusively due to severe bacterial sepsis or septic shock. (NOTE)       Sepsis PCT Algorithm           Lower Respiratory Tract                                      Infection PCT Algorithm    ----------------------------     ----------------------------         PCT < 0.25 ng/mL                PCT < 0.10 ng/mL          Strongly encourage             Strongly discourage   discontinuation of antibiotics    initiation of antibiotics    ----------------------------      -----------------------------       PCT 0.25 - 0.50 ng/mL  PCT 0.10 - 0.25 ng/mL               OR       >80% decrease in PCT            Discourage initiation of                                            antibiotics      Encourage discontinuation           of antibiotics    ----------------------------     -----------------------------         PCT >= 0.50 ng/mL              PCT 0.26 - 0.50 ng/mL                AND       <80% decrease in PCT             Encourage initiation of                                             antibiotics       Encourage continuation           of antibiotics    ----------------------------     -----------------------------        PCT >= 0.50 ng/mL                  PCT > 0.50 ng/mL               AND         increase in PCT                  Strongly encourage                                      initiation of antibiotics    Strongly encourage escalation           of antibiotics                                     -----------------------------                                           PCT <= 0.25 ng/mL                                                 OR                                        > 80% decrease in PCT  Discontinue / Do not initiate                                             antibiotics  Performed at [facility], [place] [address] [place], [place] [address]   Lactic acid, plasma     Status: Abnormal   Collection Time: 02/09/20  1:49 AM  Result Value Ref Range   Lactic Acid, Venous 2.4 (HH) 0.5 - 1.9 mmol/L    Comment: CRITICAL VALUE NOTED.  VALUE IS CONSISTENT WITH PREVIOUSLY REPORTED AND CALLED VALUE. Performed at [facility], [place] [address], [place], [place] [address]   Urinalysis, Routine w reflex microscopic Urine, Catheterized     Status: Abnormal   Collection Time: 02/09/20  1:51 AM  Result Value Ref Range   Color, Urine RED (A) YELLOW    Comment: BIOCHEMICALS MAY BE AFFECTED  BY COLOR   APPearance TURBID (A) CLEAR   Specific Gravity, Urine  1.005 - 1.030    TEST NOT REPORTED DUE TO COLOR INTERFERENCE OF URINE PIGMENT   pH  5.0 - 8.0    TEST NOT REPORTED DUE TO COLOR INTERFERENCE OF URINE PIGMENT   Glucose, UA (A) NEGATIVE mg/dL    TEST NOT REPORTED DUE TO COLOR INTERFERENCE OF URINE PIGMENT   Hgb urine dipstick (A) NEGATIVE    TEST NOT REPORTED DUE TO COLOR INTERFERENCE OF URINE PIGMENT   Bilirubin Urine (A) NEGATIVE    TEST NOT REPORTED DUE TO COLOR INTERFERENCE OF URINE PIGMENT   Ketones, ur (A) NEGATIVE mg/dL    TEST NOT REPORTED DUE TO COLOR INTERFERENCE OF URINE PIGMENT   Protein, ur (A) NEGATIVE mg/dL    TEST NOT REPORTED DUE TO COLOR INTERFERENCE OF URINE PIGMENT   Nitrite (A) NEGATIVE    TEST NOT REPORTED DUE TO COLOR INTERFERENCE OF URINE PIGMENT   Leukocytes,Ua (A) NEGATIVE    TEST NOT REPORTED DUE TO COLOR INTERFERENCE OF URINE PIGMENT    Comment: Performed at [facility], [place] [address] [place], [place] [address]  Sodium, urine, random     Status: None   Collection Time: 02/09/20  1:51 AM  Result Value Ref Range   Sodium, Ur 134 mmol/L    Comment: Performed at [place] [address], [place], [place] [address]  Protein / creatinine ratio, urine     Status: None   Collection Time: 02/09/20  1:51 AM  Result Value Ref Range   Creatinine, Urine 13.35 mg/dL   Total Protein, Urine QUANTITY NOT SUFFICIENT, UNABLE TO PERFORM TEST mg/dL    Comment: RESULTS UNAVAILABLE DUE TO INTERFERING SUBSTANCE J BEABRAUT,RN 02/09/2020 0928 WILDERK    Protein Creatinine Ratio        0.00 - 0.15 mg/mg[Cre]    Comment: RESULT BELOW REPORTABLE RANGE, UNABLE TO CALCULATE. QUANTITY NOT SUFFICIENT, UNABLE TO PERFORM TEST Performed at [facility], [place] [address] [place], [place] [address]   Urinalysis, Microscopic (reflex)     Status: Abnormal   Collection Time: 02/09/20  1:51 AM  Result Value Ref Range   RBC / HPF >50 0 - 5 RBC/hpf    WBC, UA NONE SEEN 0 - 5 WBC/hpf   Bacteria, UA RARE (A) NONE SEEN   Squamous Epithelial / LPF 0-5 0 - 5    Comment: Performed at [facility], [address] [place], [place] [address]  Iron and TIBC     Status: Abnormal   Collection Time: 02/09/20  3:12 AM  Result Value Ref Range   Iron 72 45 - 182 ug/dL   TIBC 148 (L) 250 - 450 ug/dL   Saturation Ratios 49 (H) 17.9 - 39.5 %   UIBC 76 ug/dL    Comment: Performed at [place] [address] [place], [place] [address]  Ferritin     Status: Abnormal   Collection Time: 02/09/20  3:12 AM  Result Value Ref Range   Ferritin 6,879 (H) 24 - 336 ng/mL    Comment: Performed at [facility], [place] [address], [place], [place] [address]  Renal function panel (daily at 0500)     Status: Abnormal   Collection Time: 02/09/20  3:12 AM  Result Value Ref Range   Sodium 135 135 - 145 mmol/L   Potassium 5.8 (H) 3.5 - 5.1 mmol/L   Chloride 102 98 - 111 mmol/L   CO2 13 (L) 22 - 32 mmol/L   Glucose, Bld 140 (H) 70 - 99 mg/dL    Comment: Glucose reference range applies only to samples taken after fasting for at least 8 hours.   BUN 79 (H) 8 - 23 mg/dL   Creatinine, Ser 9.25 (H) 0.61 - 1.24 mg/dL   Calcium 5.2 (LL) 8.9 - 10.3 mg/dL    Comment: CRITICAL RESULT CALLED TO, READ BACK BY AND VERIFIED WITHRocky Link RN [phone] M GARRETT    Phosphorus 11.1 (H) 2.5 - 4.6 mg/dL   Albumin 1.3 (L) 3.5 - 5.0 g/dL   GFR calc non Af Amer 5 (L) >60 mL/min   GFR calc Af Amer 6 (L) >60 mL/min   Anion gap 20 (H) 5 - 15    Comment: Performed at [place] [address] [place], [place] [address]  Magnesium     Status: Abnormal   Collection Time: 02/09/20  3:12 AM  Result Value Ref Range   Magnesium 2.7 (H) 1.7 - 2.4 mg/dL    Comment: Performed at [place] [address] [place] [address]  CBC     Status: Abnormal   Collection Time: 02/09/20  3:12 AM  Result Value Ref Range   WBC 17.4 (H) 4.0 - 10.5  K/uL   RBC 2.53 (L) 4.22 - 5.81 MIL/uL   Hemoglobin 6.0 (LL) 13.0 - 17.0 g/dL    Comment: REPEATED TO VERIFY Reticulocyte Hemoglobin testing may be clinically indicated, consider ordering this additional test [id number] THIS CRITICAL RESULT HAS VERIFIED AND BEEN CALLED TO RN NATE DAWKINS BY MESSAN HOUEGNIFIO ON 09 17 2021 AT 0407, AND HAS BEEN READ BACK.     HCT 17.1 (L) 39 - 52 %   MCV 67.6 (L) 80.0 - 100.0 fL   MCH 23.7 (L) 26.0 - 34.0 pg   MCHC 35.1 30.0 - 36.0 g/dL   RDW 14.3 11.5 - 15.5 %   Platelets 389 150 - 400 K/uL   nRBC 0.1 0.0 - 0.2 %    Comment: Performed at [place] [address], [place], [place] [address]  Prepare RBC (crossmatch)     Status: None   Collection Time: 02/09/20  4:29 AM  Result Value Ref Range   Order Confirmation      ORDER PROCESSED BY BLOOD BANK Performed at [facility], [place] [address] [place], [place]  [place]     Status: Abnormal   Collection Time: 02/09/20  5:33 AM  Result Value Ref Range   Prothrombin Time 24.1 (H) 11.4 - 15.2 seconds   INR 2.3 (H) 0.8 - 1.2    Comment: (NOTE) INR goal varies based on device and disease states. Performed at [facility], [place] [address] [place], [place] [address]   APTT     Status: Abnormal   Collection Time: 02/09/20  5:33 AM  Result Value Ref Range   aPTT 76 (H) 24 - 36 seconds    Comment:        IF BASELINE aPTT IS ELEVATED, SUGGEST PATIENT RISK ASSESSMENT BE USED TO DETERMINE APPROPRIATE ANTICOAGULANT THERAPY. Performed at [facility], [place] [address], [place], [place] [address]     US RENAL  Result Date: 02/09/2020 CLINICAL DATA:  Acute renal insufficiency EXAM: RENAL / URINARY TRACT ULTRASOUND COMPLETE COMPARISON:  None. FINDINGS: Right Kidney: Renal measurements: 11.5 x 4.8 by 4.0 cm = volume: 117 mL. Increased renal cortical echotexture. No nephrolithiasis or hydronephrosis. Left Kidney: Renal measurements: 11.9 x 5.2 by 4.6 cm = volume: 149 mL.  Increased renal cortical echotexture. No nephrolithiasis or hydronephrosis. Bladder: There is a large amount of debris within the bladder, likely related to hematuria. Prevoid volume measures 252 cc. The patient is unable to void, and there is an indwelling Foley catheter identified. Other: Trace ascites is identified. Small right pleural effusion incidentally noted. IMPRESSION: 1. Increased renal cortical echotexture consistent with medical renal disease. 2. Large amount of debris layering dependently within the bladder, likely related to blood products. Please correlate with urinalysis. 3. Incidental small right pleural effusion and trace ascites. Electronically Signed   By: Randa Ngo M.D.   On: 02/09/2020 01:16   DG CHEST PORT 1 VIEW  Result Date: 02/21/2020 CLINICAL DATA:  Central line placement EXAM: PORTABLE CHEST 1 VIEW COMPARISON:  02/14/2020 FINDINGS: Left subclavian approach central venous catheter tip projects over the lower SVC. The tip appears to be oriented either anteriorly or posteriorly. Bibasilar atelectasis.  Normal pleural spaces. IMPRESSION: Left subclavian approach central venous catheter tip projecting over the lower SVC. Electronically Signed   By: Ulyses Jarred M.D.   On: 02/19/2020 22:21   DG Chest Port 1 View  Result Date: 02/16/2020 CLINICAL DATA:  Weakness and shortness of breath EXAM: PORTABLE CHEST 1 VIEW COMPARISON:  September 17, 2007 FINDINGS: The heart size and mediastinal contours are mildly enlarged. Aortic knob calcifications are seen. There is prominence of the central pulmonary vasculature. No acute osseous abnormality. IMPRESSION: Mild cardiomegaly and pulmonary vascular congestion. Electronically Signed   By: Prudencio Pair M.D.   On: 01/28/2020 17:35   ECHOCARDIOGRAM COMPLETE  Result Date: 02/09/2020    ECHOCARDIOGRAM REPORT   Patient Name:   Jason Good Date of Exam: 02/09/2020 Medical Rec #:  [id number]          Height:       66.0 in Accession #:     [id number]         Weight:       125.0 lb Date of Birth:  [birth year]          BSA:          1.638 m Patient Age:    39 years           BP:           119/55 mmHg Patient Gender: M  HR:           72 bpm. Exam Location:  Inpatient Procedure: 2D Echo, Cardiac Doppler and Color Doppler Indications:    Pericardial effusion  History:        Patient has no prior history of Echocardiogram examinations.                 Risk Factors:Hypertension, Dyslipidemia and Current Smoker.                 ESRD.  Sonographer:    Clayton Lefort RDCS (AE) Referring Phys: [place]  1. Left ventricular ejection fraction, by estimation, is 65 to 70%. The left ventricle has normal function. The left ventricle has no regional wall motion abnormalities. There is mild concentric left ventricular hypertrophy. Left ventricular diastolic parameters were normal.  2. Right ventricular systolic function is normal. The right ventricular size is normal. There is normal pulmonary artery systolic pressure. The estimated right ventricular systolic pressure is 10.1 mmHg.  3. Left atrial size was mildly dilated.  4. A small pericardial effusion is present. The pericardial effusion is circumferential. There is no evidence of cardiac tamponade.  5. The mitral valve is grossly normal. Trivial mitral valve regurgitation. No evidence of mitral stenosis.  6. The aortic valve is tricuspid. There is mild calcification of the aortic valve. There is mild thickening of the aortic valve. Aortic valve regurgitation is mild. Mild aortic valve sclerosis is present, with no evidence of aortic valve stenosis.  7. The inferior vena cava is normal in size with greater than 50% respiratory variability, suggesting right atrial pressure of 3 mmHg. FINDINGS  Left Ventricle: Left ventricular ejection fraction, by estimation, is 65 to 70%. The left ventricle has normal function. The left ventricle has no regional wall motion abnormalities.  The left ventricular internal cavity size was normal in size. There is  mild concentric left ventricular hypertrophy. Left ventricular diastolic parameters were normal. Right Ventricle: The right ventricular size is normal. No increase in right ventricular wall thickness. Right ventricular systolic function is normal. There is normal pulmonary artery systolic pressure. The tricuspid regurgitant velocity is 2.19 m/s, and  with an assumed right atrial pressure of 3 mmHg, the estimated right ventricular systolic pressure is 75.1 mmHg. Left Atrium: Left atrial size was mildly dilated. Right Atrium: Right atrial size was normal in size. Pericardium: A small pericardial effusion is present. The pericardial effusion is circumferential. There is no evidence of cardiac tamponade. Mitral Valve: The mitral valve is grossly normal. There is mild thickening of the anterior and posterior mitral valve leaflet(s). Trivial mitral valve regurgitation. No evidence of mitral valve stenosis. Tricuspid Valve: The tricuspid valve is grossly normal. Tricuspid valve regurgitation is trivial. No evidence of tricuspid stenosis. Aortic Valve: The aortic valve is tricuspid. There is mild calcification of the aortic valve. There is mild thickening of the aortic valve. Aortic valve regurgitation is mild. Aortic regurgitation PHT measures 759 msec. Mild aortic valve sclerosis is present, with no evidence of aortic valve stenosis. Aortic valve mean gradient measures 3.0 mmHg. Aortic valve peak gradient measures 8.8 mmHg. Aortic valve area, by VTI measures 2.83 cm. Pulmonic Valve: The pulmonic valve was grossly normal. Pulmonic valve regurgitation is not visualized. No evidence of pulmonic stenosis. Aorta: The aortic root and ascending aorta are structurally normal, with no evidence of dilitation. Venous: The inferior vena cava is normal in size with greater than 50% respiratory variability, suggesting right atrial pressure of 3 mmHg.  IAS/Shunts:  The atrial septum is grossly normal.  LEFT VENTRICLE PLAX 2D LVIDd:         4.20 cm  Diastology LVIDs:         2.70 cm  LV e' medial:    10.90 cm/s LV PW:         1.50 cm  LV E/e' medial:  7.9 LV IVS:        1.00 cm  LV e' lateral:   13.20 cm/s LVOT diam:     2.10 cm  LV E/e' lateral: 6.5 LV SV:         80 LV SV Index:   49 LVOT Area:     3.46 cm  RIGHT VENTRICLE             IVC RV Basal diam:  2.90 cm     IVC diam: 1.50 cm RV S prime:     13.20 cm/s TAPSE (M-mode): 1.9 cm LEFT ATRIUM             Index       RIGHT ATRIUM           Index LA diam:        2.90 cm 1.77 cm/m  RA Area:     16.20 cm LA Vol (A2C):   61.2 ml 37.37 ml/m RA Volume:   42.50 ml  25.95 ml/m LA Vol (A4C):   50.5 ml 30.84 ml/m LA Biplane Vol: 58.8 ml 35.90 ml/m  AORTIC VALVE AV Area (Vmax):    2.81 cm AV Area (Vmean):   2.84 cm AV Area (VTI):     2.83 cm AV Vmax:           148.00 cm/s AV Vmean:          83.500 cm/s AV VTI:            0.281 m AV Peak Grad:      8.8 mmHg AV Mean Grad:      3.0 mmHg LVOT Vmax:         120.00 cm/s LVOT Vmean:        68.400 cm/s LVOT VTI:          0.230 m LVOT/AV VTI ratio: 0.82 AI PHT:            759 msec  AORTA Ao Root diam: 3.40 cm Ao Asc diam:  3.40 cm MITRAL VALVE               TRICUSPID VALVE MV Area (PHT): 3.17 cm    TR Peak grad:   19.2 mmHg MV Decel Time: 239 msec    TR Vmax:        219.00 cm/s MV E velocity: 86.30 cm/s MV A velocity: 53.80 cm/s  SHUNTS MV E/A ratio:  1.60        Systemic VTI:  0.23 m                            Systemic Diam: 2.10 cm Eleonore Chiquito MD Electronically signed by Eleonore Chiquito MD Signature Date/Time: 02/09/2020/10:59:39 AM    Final     Review of Systems  Unable to perform ROS: Mental status change   Blood pressure (!) 145/62, pulse 67, temperature (!) 95.5 F (35.3 C), resp. rate (!) 21, height 5\' 6"  (1.676 m), weight 56.7 kg, SpO2 96 %. Physical Exam Vitals reviewed.  Constitutional:      Comments: Ao x 1. Non-combative  in ICU on CRRT. 1:1 NSG at bedside and  extremely helpful.   HENT:     Head: Normocephalic.     Nose: Nose normal.  Eyes:     Pupils: Pupils are equal, round, and reactive to light.  Cardiovascular:     Rate and Rhythm: Normal rate.  Pulmonary:     Effort: Pulmonary effort is normal.  Abdominal:     General: Abdomen is flat.  Genitourinary:    Comments: In situ 31F catheter with clotted blood in lumen.  Musculoskeletal:        General: Normal range of motion.     Cervical back: Normal range of motion.     Comments: Rt groin CRRT catheter in place, no sking reaction. Clean.   Skin:    General: Skin is warm.     BEDSIDE CLOT EVACUATION: Using aseptic technique, in situ catheter removed, penis reprepped with betadine, and 41F hematuria coude catheter placed with immediate efflux of 538mL dark bloody urine. Hand irrigated in 60cc aliquots for 273mL to think pink and placed on NS irrigation at 2gtt per second, efflux light pink. NSG instructed on management.   Assessment/Plan:  1 - Acute Renal Failure - suspect pre-renal in setting of sepsis / SIRS + some intrinsic renal dysfunction. No hydro to suggest significant obstructive component. Appreciate nephrology comanagement and agree with CRRT given borderline hemodynamics.   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 Continue 3 way foley on gentle traction and continuous bladder irrigation  to hopefully mitigate. Necessary anticoagulation for CRRT certainly exacerbates.  3 - Prostate Cancer - excellent biochemical control. No mets by imaging.   4 - Liver Lesions / Likely Metastatic Cancer - low suspicion for GU primary given lack of large GU masses, very low PSA, and no pelvic adenopathy.   Jason Good 02/09/2020, 11:44 AM

## 2020-02-09 NOTE — Progress Notes (Addendum)
NAME:  Jason Good, MRN:  [id number], DOB:  June 09, 1950, LOS: 1 ADMISSION DATE:  02/05/2020, CONSULTATION DATE:  01/27/2020 REFERRING MD:  Sabra Heck - [facility]  CHIEF COMPLAINT:  Weakness, dyspnea   Brief History   Jason Good is a 69 y.o. male who was admitted 9/16 with generalized weakness and found to have significant renal failure with life threatening hyperkalemia.  He was transferred to [facility] for emergent HD.  History of present illness   Jason Good is a 69 y.o. male who has a PMH including but not limited to HTN, HLD (see "past medical history" for rest).  He presented to [facility] ED 9/16 with weakness and dyspnea.  He was found to be bradycardic in the 30's and had SBP in 90's.    Workup revealed significant renal failure with life threatening hyperkalemia for which he received temporizing measures. Nephrology was consulted and recommended transfer to [facility] for emergent HD.  A trialysis catheter was placed in right femoral vein by EDP prior to transfer.  He stated that he had nausea and diarrhea for a week.  He did notice some dark stools 1 week ago which has since improved. Denies fevers/chills/sweats, headaches, chest pain, wheezing, abd pain, myalgias, exposures to known sick contacts.  Past Medical History  has HYPERLIPIDEMIA; ANEMIA-NOS; GLAUCOMA NOS; CATARACT NOS; HYPERTENSION; OSTEOARTHRITIS; LOW BACK PAIN; ESRD (end stage renal disease) ([place]); Acute renal failure ([place]); AKI (acute kidney injury) ([place]); Hyperkalemia; and Acute renal failure (ARF) ([place]) on their problem list.  Significant Hospital Events   9/16 > admit.  Consults:  Nephrology.  Procedures:  R femoral HD cath 9/16 (EDP) >  CVL pending 9/16 >  Arterial line 9/16 >  Significant Diagnostic Tests:  CXR 9/16 > vascular congestion.  Micro Data:  COVID 9/16 > neg.  Antimicrobials:  None.   Interim history/subjective:  Confused On CRRT  Objective:  Blood pressure 117/74, pulse 73, temperature (!) 97.2 F  (36.2 C), resp. rate (!) 25, height 5\' 6"  (1.676 m), weight 56.7 kg, SpO2 92 %.        Intake/Output Summary (Last 24 hours) at 02/09/2020 1521 Last data filed at 02/09/2020 1500 Gross per 24 hour  Intake 3397.96 ml  Output 1625 ml  Net 1772.96 ml   Filed Weights   02/09/2020 1647 02/04/2020 2200  Weight: 55.8 kg 56.7 kg    Examination: General: Chronically ill-appearing Neuro: Confused, moving all extremities HEENT: Dry oral mucosa Cardiovascular: RRR, no M/R/G.  Lungs: Clear to auscultation Abdomen: Bowel sounds appreciated Musculoskeletal: No gross deformities, no edema.  Skin: Intact, warm, no rashes.  Assessment & Plan:  Acute kidney injury Hyperkalemia -On CRRT -Continue to monitor BMP  Hypotension -Pressors to keep MAP greater than 65  Anemia -Secondary to blood loss anemia -Has gross hematuria -Transfuse per protocol  Gross hematuria -Consult urology  Protein calorie malnutrition -Nutrition consult when more stable  Hypertension, hyperlipidemia  Caregiver was reached who gave a history of patient having prostate cancer Follow-up on CT head, CT chest CT abdomen is suggestive of multiple masses, metastatic deposits  Empirically started on antibiotics -Atelectasis at the bases of the lungs, leukocytosis, unclear history of whether he was acutely ill prior to decompensation  Best Practice:  Diet: Heart healthy. Pain/Anxiety/Delirium protocol (if indicated): N/A. VAP protocol (if indicated): N/A. DVT prophylaxis: SCD's only. GI prophylaxis: N/A. Glucose control: SSI if glucose consistently > 180. Mobility: Bedrest. Code Status: Full. Family Communication: None available. Disposition: ICU.  Labs  CBC: Recent Labs  Lab 02/04/2020 1659 02/02/2020 1700 02/14/2020 1907 02/09/20 0312  WBC  --  16.9*  --  17.4*  NEUTROABS  --  13.3*  --   --   HGB 7.5* 7.4* 6.5* 6.0*  HCT 22.0* 21.5* 19.0* 17.1*  MCV  --  71.0*  --  67.6*  PLT  --  403*  --  322    Basic Metabolic Panel: Recent Labs  Lab 02/17/2020 1659 02/16/2020 1700 02/12/2020 1904 01/30/2020 1907 02/09/20 0312  NA 130* 133*  --  134* 135  K 7.0* 7.3*  --  5.9* 5.8*  CL 103 99  --  104 102  CO2  --  10*  --   --  13*  GLUCOSE 103* 109*  --  140* 140*  BUN 84* 99*  --  80* 79*  CREATININE 14.50* 13.10*  --  14.30* 9.25*  CALCIUM  --  5.2*  --   --  5.2*  MG  --  2.6*  --   --  2.7*  PHOS  --   --  15.2*  --  11.1*   GFR: Estimated Creatinine Clearance: 6 mL/min (A) (by C-G formula based on SCr of 9.25 mg/dL (H)). Recent Labs  Lab 02/07/2020 1700 02/17/2020 2205 02/09/20 0149 02/09/20 0312  PROCALCITON  --  26.82  --   --   WBC 16.9*  --   --  17.4*  LATICACIDVEN  --  2.0* 2.4*  --    Liver Function Tests: Recent Labs  Lab 01/27/2020 1700 02/09/20 0312  AST 370*  --   ALT 43  --   ALKPHOS 391*  --   BILITOT 3.6*  --   PROT 5.8*  --   ALBUMIN 1.7* 1.3*   No results for input(s): LIPASE, AMYLASE in the last 168 hours. No results for input(s): AMMONIA in the last 168 hours. ABG    Component Value Date/Time   TCO2 13 (L) 02/20/2020 1907    Coagulation Profile: Recent Labs  Lab 02/16/2020 1700 02/09/20 0533  INR 2.2* 2.3*   Cardiac Enzymes: Recent Labs  Lab 01/27/2020 2205  CKTOTAL 2,982*   HbA1C: No results found for: HGBA1C CBG: Recent Labs  Lab 02/13/2020 1900 02/03/2020 2050  GLUCAP 151* 127*   Past medical history  He,  has a past medical history of Back pain, High cholesterol, and Hypertension.   Surgical History   No past surgical history on file.   Social History   reports that he has been smoking cigarettes. He does not have any smokeless tobacco history on file. He reports current alcohol use. He reports that he does not use drugs.   Family history   His family history is not on file.   Allergies No Known Allergies    The patient is critically ill with multiple organ systems failure and requires high complexity decision making for  assessment and support, frequent evaluation and titration of therapies, application of advanced monitoring technologies and extensive interpretation of multiple databases. Critical Care Time devoted to patient care services described in this note independent of APP/resident time (if applicable)  is 30 minutes.   Sherrilyn Rist MD [facility] Personal pager: [phone] If unanswered, please page CCM On-call: [phone]

## 2020-02-09 NOTE — Progress Notes (Signed)
CRITICAL VALUE ALERT  Critical Value: Hemoglobin 6.0  Elink provider notified 02/09/20 @ 0411.

## 2020-02-09 NOTE — Progress Notes (Signed)
eLink Physician-Brief Progress Note Patient Name: Jason Good DOB: 30-Jul-1950 MRN: [id number]   Date of Service  02/09/2020  HPI/Events of Note  Unable to obtain consent for blood transfusion from patient's listed next of kin, his brother Keyton Bhat because he recently passed away. No other known relative to contact.  eICU Interventions  I have authorized the blood transfusion under emergency consent protocols.        [name] Artha Chiasson 02/09/2020, 6:07 AM

## 2020-02-09 NOTE — Progress Notes (Signed)
[facility] Progress Note  Name: Jason Good MRN: [id number] DOB: 16-Nov-1950   Subjective:  Per nursing CRRT going ok.  He states pt is to get 2 units of PRBC's today. pt has 335 mL UOP over 9/16 charted.  He has been keep even as tolerated - around 400 mL UF with CRRT.  On levo at 11 mcg/min and vasopressin.   Review of systems:  Some shortness of breath Denies n/v States urinating blood for three days ------------ Background on consult:  Jason Good is a 69 y.o. male with past medical history significant for hypertension, HLD, osteoarthritis, presented to [facility] ER for shortness of breath, weakness seen as a consultation for the evaluation of AKI and hyperkalemia.  Initially, the EMS found bradycardic to 32R and systolic blood pressure around 90s. In the ER, he was hypothermic to 93, SBP as low as 70s. The initial labs showed serum sodium of 130, potassium 7, CO2 10, BUN 84, creatinine level of 14.5, calcium 5.2, hemoglobin 7.5. The EKG consistent with peaked T wave. He was medically managed with normal saline bolus, insulin, dextrose, calcium gluconate, Lokelma, sodium bicarbonate. The chest x-ray consistent with mild cardiomegaly and pulmonary vascular congestion. Received IV Lasix to manage hyperkalemia. The repeat labs showed serum potassium level worsened to 7.3, BUN 99 and creatinine level 13.1. Discussed with the ER physician and decided to transfer the patient to [facility] for emergent renal replacement therapy. A right femoral temporary HD catheter was placed by [facility] ER physician.  The recent baseline serum creatinine level unknown. As per patient he was recently started on blood pressure medication by his PCP. He was feeling weak tired and not eating much. The home medications include lisinopril 20 mg, amlodipine, aspirin, labetalol, naproxen 500 mg twice a day, oxycodone and Crestor.  Intake/Output Summary (Last 24 hours) at 02/09/2020 0739 Last data filed at  02/09/2020 0700 Gross per 24 hour  Intake 1662.88 ml  Output 728 ml  Net 934.88 ml    Vitals:  Vitals:   02/09/20 0500 02/09/20 0600 02/09/20 0615 02/09/20 0630  BP: (!) 144/63 127/62    Pulse: 71 77 78 74  Resp: 16 (!) 22 (!) 21 (!) 22  Temp: (!) 94.1 F (34.5 C) (!) 95 F (35 C) (!) 95 F (35 C) (!) 95.2 F (35.1 C)  TempSrc:      SpO2: 99% 100% 99% 95%  Weight:      Height:         Physical Exam:  General adult male in bed in NAD HEENT normocephalic atraumatic extraocular movements intact  Neck supple trachea midline Lungs clear to auscultation bilaterally normal work of breathing at rest; on 4.5 liters oxygen  Heart S1S2 no rub Abdomen soft/thin/nontender Extremities no edema  Access right fem nontunneled catheter   Medications reviewed   Labs:  BMP Latest Ref Rng & Units 02/09/2020 02/12/2020 01/27/2020  Glucose 70 - 99 mg/dL 140(H) 140(H) 109(H)  BUN 8 - 23 mg/dL 79(H) 80(H) 99(H)  Creatinine 0.61 - 1.24 mg/dL 9.25(H) 14.30(H) 13.10(H)  Sodium 135 - 145 mmol/L 135 134(L) 133(L)  Potassium 3.5 - 5.1 mmol/L 5.8(H) 5.9(H) 7.3(HH)  Chloride 98 - 111 mmol/L 102 104 99  CO2 22 - 32 mmol/L 13(L) - 10(L)  Calcium 8.9 - 10.3 mg/dL 5.2(LL) - 5.2(LL)     Assessment/Plan:   # AKI  - Baseline creatinine level unknown - may have some CKD.  AKI due to hypotension/shock with lisinopril concomitant  with NSAIDs use, decreased oral intake versus progressive advanced CKD.  Renal US with increased echotexture and no hydro - Started on CRRT via right femoral nontunneled HD catheter on 9/17.   - Keep even as tolerated - Improving on 2 k fluids.  Increase dialysate 2 liters/hr.  Follow BID labs.  No systemic heparin - noted UA with test not reported for much of results due to pigment.  Gross hematuria per nursing   # Severe life-threatening hyperkalemia with bradycardia and EKG changes in the setting of renal failure, lisinopril use: Status post medical treatment and now on  CRRT  #Anion gap metabolic acidosis with renal failure: Started on CRRT.  Will stop bicarb  #Hypocalcemia/CKD MBD: ordered 2 gram IV calcium - aggressive repletion. PTH is pending.  hyperphosphatemia - on CRRT for now thus defer binder  #Severe anemia related with advanced renal failure: Check iron studies - pending.  Agree with PRBC's - 2 units today per nursing report.  Further evaluation per critical care  #Hypotensive/shock: Systolic blood pressure as low as 70s in ER. Currently on Levophed and vaso.  NS bolus 500 mL once now (not to be removed with CRRT)  # Gross hematuria - would consult urology   #HLD: CK level 2982 - hold statin    Claudia Desanctis, MD 02/09/2020 8:02 AM

## 2020-02-09 NOTE — Progress Notes (Addendum)
[facility] Progress Note Patient Name: Jason Good DOB: 10/15/1950 MRN: [id number]   Date of Service  02/09/2020  HPI/Events of Note  Patient has a history of chronic back pain for which he takes Oxycodone  At home.  eICU Interventions  Oxycodone 5 mg tabs ordered PRN pain.        [name] Jazmine Heckman 02/09/2020, 3:47 AM

## 2020-02-09 NOTE — Progress Notes (Addendum)
[place] Progress Note Patient Name: Jason Good DOB: 01-25-1951 MRN: [id number]   Date of Service  02/09/2020  HPI/Events of Note  Patient with a drop in his hemoglobin to 6.0 gm. He has some gross hematuria per bedside RN. Recent urinalysis indicates significant hematuria.  eICU Interventions  Transfuse 2 units PRBC, urology evaluation in a.m. Screening for coagulopathy.        Malachy Coleman U Larell Baney 02/09/2020, 4:20 AM

## 2020-02-09 NOTE — Progress Notes (Signed)
  Echocardiogram 2D Echocardiogram has been performed.  Jason Good 02/09/2020, 8:49 AM

## 2020-02-09 NOTE — Progress Notes (Signed)
Pharmacy Antibiotic Note  Jason Good is a 69 y.o. male admitted on 02/09/2020 with hypothermia, leukocytosis, and lactic acidosis to start empiric therapy for sepsis.  Pharmacy has been consulted for Cefepime dosing.  Patient is currently requiring levophed and vasopressin.  He was initiated on CRRT therapy (known ESRD).   Plan: Cefepime 2g IV every 12 hours while on CRRT. Follow-up renal plans, clinical status, and culture results.    Height: 5\' 6"  (167.6 cm) Weight: 56.7 kg (125 lb) IBW/kg (Calculated) : 63.8  Temp (24hrs), Avg:95.1 F (35.1 C), Min:92.8 F (33.8 C), Max:95.7 F (35.4 C)  Recent Labs  Lab 01/25/2020 1659 01/25/2020 1700 02/02/2020 1907 02/12/2020 2205 02/09/20 0149 02/09/20 0312  WBC  --  16.9*  --   --   --  17.4*  CREATININE 14.50* 13.10* 14.30*  --   --  9.25*  LATICACIDVEN  --   --   --  2.0* 2.4*  --     Estimated Creatinine Clearance: 6 mL/min (A) (by C-G formula based on SCr of 9.25 mg/dL (H)).    No Known Allergies  Antimicrobials this admission: Cefepime 9/17 >>  Dose adjustments this admission:   Microbiology results: 9/16 COVID negative  Thank you for allowing pharmacy to be a part of this patient's care.  Sloan Leiter, PharmD, BCPS, BCCCP Clinical Pharmacist Please refer to [facility] for [place] numbers 02/09/2020 11:43 AM

## 2020-02-10 ENCOUNTER — Inpatient Hospital Stay (HOSPITAL_COMMUNITY): Payer: Medicare Other

## 2020-02-10 ENCOUNTER — Encounter (HOSPITAL_COMMUNITY): Payer: Self-pay | Admitting: Pulmonary Disease

## 2020-02-10 DIAGNOSIS — Z7189 Other specified counseling: Secondary | ICD-10-CM

## 2020-02-10 DIAGNOSIS — J9690 Respiratory failure, unspecified, unspecified whether with hypoxia or hypercapnia: Secondary | ICD-10-CM

## 2020-02-10 DIAGNOSIS — Z515 Encounter for palliative care: Secondary | ICD-10-CM

## 2020-02-10 DIAGNOSIS — N171 Acute kidney failure with acute cortical necrosis: Secondary | ICD-10-CM

## 2020-02-10 DIAGNOSIS — J9600 Acute respiratory failure, unspecified whether with hypoxia or hypercapnia: Secondary | ICD-10-CM

## 2020-02-10 DIAGNOSIS — N179 Acute kidney failure, unspecified: Principal | ICD-10-CM

## 2020-02-10 LAB — POCT I-STAT 7, (LYTES, BLD GAS, ICA,H+H)
Acid-base deficit: 7 mmol/L — ABNORMAL HIGH (ref 0.0–2.0)
Acid-base deficit: 8 mmol/L — ABNORMAL HIGH (ref 0.0–2.0)
Acid-base deficit: 3 mmol/L — ABNORMAL HIGH (ref 0.0–2.0)
Bicarbonate: 20.9 mmol/L (ref 20.0–28.0)
Bicarbonate: 21 mmol/L (ref 20.0–28.0)
Bicarbonate: 22.9 mmol/L (ref 20.0–28.0)
Calcium, Ion: 0.9 mmol/L — ABNORMAL LOW (ref 1.15–1.40)
Calcium, Ion: 0.9 mmol/L — ABNORMAL LOW (ref 1.15–1.40)
Calcium, Ion: 0.84 mmol/L — CL (ref 1.15–1.40)
HCT: 25 % — ABNORMAL LOW (ref 39.0–52.0)
HCT: 23 % — ABNORMAL LOW (ref 39.0–52.0)
HCT: 26 % — ABNORMAL LOW (ref 39.0–52.0)
Hemoglobin: 8.5 g/dL — ABNORMAL LOW (ref 13.0–17.0)
Hemoglobin: 7.8 g/dL — ABNORMAL LOW (ref 13.0–17.0)
Hemoglobin: 8.8 g/dL — ABNORMAL LOW (ref 13.0–17.0)
O2 Saturation: 75 %
O2 Saturation: 97 %
O2 Saturation: 100 %
Patient temperature: 94.7
Patient temperature: 93.8
Patient temperature: 93.7
Potassium: 4.7 mmol/L (ref 3.5–5.1)
Potassium: 4.5 mmol/L (ref 3.5–5.1)
Potassium: 4.1 mmol/L (ref 3.5–5.1)
Sodium: 137 mmol/L (ref 135–145)
Sodium: 137 mmol/L (ref 135–145)
Sodium: 138 mmol/L (ref 135–145)
TCO2: 23 mmol/L (ref 22–32)
TCO2: 23 mmol/L (ref 22–32)
TCO2: 24 mmol/L (ref 22–32)
pCO2 arterial: 50.1 mmHg — ABNORMAL HIGH (ref 32.0–48.0)
pCO2 arterial: 58.9 mmHg — ABNORMAL HIGH (ref 32.0–48.0)
pCO2 arterial: 37.2 mmHg (ref 32.0–48.0)
pH, Arterial: 7.216 — ABNORMAL LOW (ref 7.350–7.450)
pH, Arterial: 7.143 — CL (ref 7.350–7.450)
pH, Arterial: 7.385 (ref 7.350–7.450)
pO2, Arterial: 43 mmHg — ABNORMAL LOW (ref 83.0–108.0)
pO2, Arterial: 107 mmHg (ref 83.0–108.0)
pO2, Arterial: 269 mmHg — ABNORMAL HIGH (ref 83.0–108.0)

## 2020-02-10 LAB — CBC WITH DIFFERENTIAL/PLATELET
Abs Immature Granulocytes: 0.16 10*3/uL — ABNORMAL HIGH (ref 0.00–0.07)
Basophils Absolute: 0 10*3/uL (ref 0.0–0.1)
Basophils Relative: 0 %
Eosinophils Absolute: 0 10*3/uL (ref 0.0–0.5)
Eosinophils Relative: 0 %
HCT: 23.5 % — ABNORMAL LOW (ref 39.0–52.0)
Hemoglobin: 8.1 g/dL — ABNORMAL LOW (ref 13.0–17.0)
Immature Granulocytes: 1 %
Lymphocytes Relative: 9 %
Lymphs Abs: 1.3 10*3/uL (ref 0.7–4.0)
MCH: 25.2 pg — ABNORMAL LOW (ref 26.0–34.0)
MCHC: 34.5 g/dL (ref 30.0–36.0)
MCV: 73.2 fL — ABNORMAL LOW (ref 80.0–100.0)
Monocytes Absolute: 0.6 10*3/uL (ref 0.1–1.0)
Monocytes Relative: 4 %
Neutro Abs: 13.1 10*3/uL — ABNORMAL HIGH (ref 1.7–7.7)
Neutrophils Relative %: 86 %
Platelets: 227 10*3/uL (ref 150–400)
RBC: 3.21 MIL/uL — ABNORMAL LOW (ref 4.22–5.81)
RDW: 17.9 % — ABNORMAL HIGH (ref 11.5–15.5)
WBC: 15.3 10*3/uL — ABNORMAL HIGH (ref 4.0–10.5)
nRBC: 0.7 % — ABNORMAL HIGH (ref 0.0–0.2)

## 2020-02-10 LAB — PREPARE RBC (CROSSMATCH)

## 2020-02-10 LAB — PROTIME-INR
INR: 2.4 — ABNORMAL HIGH (ref 0.8–1.2)
Prothrombin Time: 25.7 seconds — ABNORMAL HIGH (ref 11.4–15.2)

## 2020-02-10 LAB — HEMOGLOBIN AND HEMATOCRIT, BLOOD
HCT: 23.9 % — ABNORMAL LOW (ref 39.0–52.0)
Hemoglobin: 8.3 g/dL — ABNORMAL LOW (ref 13.0–17.0)

## 2020-02-10 LAB — MAGNESIUM
Magnesium: 2.8 mg/dL — ABNORMAL HIGH (ref 1.7–2.4)
Magnesium: 2.9 mg/dL — ABNORMAL HIGH (ref 1.7–2.4)

## 2020-02-10 LAB — APTT: aPTT: 93 seconds — ABNORMAL HIGH (ref 24–36)

## 2020-02-10 LAB — LACTIC ACID, PLASMA
Lactic Acid, Venous: 6.5 mmol/L (ref 0.5–1.9)
Lactic Acid, Venous: 10.6 mmol/L (ref 0.5–1.9)
Lactic Acid, Venous: >11 mmol/L (ref 0.5–1.9)

## 2020-02-10 LAB — GLUCOSE, CAPILLARY
Glucose-Capillary: 68 mg/dL — ABNORMAL LOW (ref 70–99)
Glucose-Capillary: 61 mg/dL — ABNORMAL LOW (ref 70–99)
Glucose-Capillary: 89 mg/dL (ref 70–99)
Glucose-Capillary: 137 mg/dL — ABNORMAL HIGH (ref 70–99)
Glucose-Capillary: <10 mg/dL — CL (ref 70–99)

## 2020-02-10 LAB — AMMONIA: Ammonia: 57 umol/L — ABNORMAL HIGH (ref 9–35)

## 2020-02-10 LAB — PHOSPHORUS
Phosphorus: 6.7 mg/dL — ABNORMAL HIGH (ref 2.5–4.6)
Phosphorus: 6.1 mg/dL — ABNORMAL HIGH (ref 2.5–4.6)

## 2020-02-10 LAB — PARATHYROID HORMONE, INTACT (NO CA): PTH: 475 pg/mL — ABNORMAL HIGH (ref 15–65)

## 2020-02-10 MED ORDER — DEXTROSE 50 % IV SOLN
INTRAVENOUS | Status: AC
  Administered 2020-02-10: 50 mL
  Filled 2020-02-10: qty 50

## 2020-02-10 MED ORDER — PROSOURCE TF PO LIQD
45.0000 mL | Freq: Two times a day (BID) | ORAL | Status: DC
  Administered 2020-02-10: 45 mL
  Filled 2020-02-10: qty 45

## 2020-02-10 MED ORDER — MORPHINE SULFATE (PF) 2 MG/ML IV SOLN: 2.0000 mg | INTRAVENOUS | Status: DC | PRN

## 2020-02-10 MED ORDER — NOREPINEPHRINE 16 MG/250ML-% IV SOLN
0.0000 ug/min | INTRAVENOUS | Status: DC
  Administered 2020-02-10: 40 ug/min via INTRAVENOUS
  Filled 2020-02-10 (×2): qty 250

## 2020-02-10 MED ORDER — DIPHENHYDRAMINE HCL 50 MG/ML IJ SOLN: 25.0000 mg | INTRAMUSCULAR | Status: DC | PRN

## 2020-02-10 MED ORDER — GLYCOPYRROLATE 0.2 MG/ML IJ SOLN: 0.2000 mg | INTRAMUSCULAR | Status: DC | PRN

## 2020-02-10 MED ORDER — FENTANYL CITRATE (PF) 100 MCG/2ML IJ SOLN
INTRAMUSCULAR | Status: AC
  Filled 2020-02-10: qty 4

## 2020-02-10 MED ORDER — ALBUMIN HUMAN 5 % IV SOLN
INTRAVENOUS | Status: AC
  Administered 2020-02-10: 12.5 g
  Filled 2020-02-10: qty 250

## 2020-02-10 MED ORDER — DOCUSATE SODIUM 50 MG/5ML PO LIQD
100.0000 mg | Freq: Two times a day (BID) | ORAL | Status: DC
  Administered 2020-02-10: 100 mg via ORAL
  Filled 2020-02-10: qty 10

## 2020-02-10 MED ORDER — FENTANYL CITRATE (PF) 100 MCG/2ML IJ SOLN
25.0000 ug | INTRAMUSCULAR | Status: DC | PRN
  Administered 2020-02-10: 100 ug via INTRAVENOUS

## 2020-02-10 MED ORDER — GLYCOPYRROLATE 1 MG PO TABS: 1.0000 mg | ORAL_TABLET | ORAL | Status: DC | PRN

## 2020-02-10 MED ORDER — LORAZEPAM 2 MG/ML IJ SOLN
2.0000 mg | Freq: Once | INTRAMUSCULAR | Status: AC
  Administered 2020-02-10: 2 mg via INTRAVENOUS
  Filled 2020-02-10: qty 1

## 2020-02-10 MED ORDER — ACETAMINOPHEN 650 MG RE SUPP: 650.0000 mg | Freq: Four times a day (QID) | RECTAL | Status: DC | PRN

## 2020-02-10 MED ORDER — MIDAZOLAM HCL 2 MG/2ML IJ SOLN
INTRAMUSCULAR | Status: AC
  Filled 2020-02-10: qty 4

## 2020-02-10 MED ORDER — ALBUMIN HUMAN 5 % IV SOLN: 25.0000 g | Freq: Once | INTRAVENOUS | Status: DC

## 2020-02-10 MED ORDER — FENTANYL CITRATE (PF) 100 MCG/2ML IJ SOLN: 25.0000 ug | INTRAMUSCULAR | Status: DC | PRN

## 2020-02-10 MED ORDER — VITAL HIGH PROTEIN PO LIQD: 1000.0000 mL | ORAL | Status: DC

## 2020-02-10 MED ORDER — ALBUMIN HUMAN 5 % IV SOLN: 12.5000 g | Freq: Once | INTRAVENOUS | Status: AC

## 2020-02-10 MED ORDER — ATROPINE SULFATE 1 MG/10ML IJ SOSY
0.5000 mg | PREFILLED_SYRINGE | Freq: Once | INTRAMUSCULAR | Status: AC
  Administered 2020-02-10: 0.5 mg via INTRAVENOUS

## 2020-02-10 MED ORDER — DOCUSATE SODIUM 50 MG/5ML PO LIQD: 100.0000 mg | Freq: Two times a day (BID) | ORAL | Status: DC | PRN

## 2020-02-10 MED ORDER — ACETAMINOPHEN 325 MG PO TABS: 650.0000 mg | ORAL_TABLET | Freq: Four times a day (QID) | ORAL | Status: DC | PRN

## 2020-02-10 MED ORDER — VITAMIN K1 10 MG/ML IJ SOLN
10.0000 mg | Freq: Once | INTRAVENOUS | Status: AC
  Administered 2020-02-10: 10 mg via INTRAVENOUS
  Filled 2020-02-10: qty 1

## 2020-02-10 MED ORDER — DEXTROSE 5 % IV SOLN: INTRAVENOUS | Status: DC

## 2020-02-10 MED ORDER — DEXTROSE 50 % IV SOLN
12.5000 g | Freq: Once | INTRAVENOUS | Status: AC
  Administered 2020-02-10: 12.5 g via INTRAVENOUS

## 2020-02-10 MED ORDER — POLYETHYLENE GLYCOL 3350 17 G PO PACK
17.0000 g | PACK | Freq: Every day | ORAL | Status: DC
  Administered 2020-02-10: 17 g via ORAL
  Filled 2020-02-10: qty 1

## 2020-02-10 MED ORDER — POLYETHYLENE GLYCOL 3350 17 G PO PACK: 17.0000 g | PACK | Freq: Every day | ORAL | Status: DC

## 2020-02-10 MED ORDER — SODIUM BICARBONATE 8.4 % IV SOLN
INTRAVENOUS | Status: AC
  Filled 2020-02-10: qty 200

## 2020-02-10 MED ORDER — VITAL HIGH PROTEIN PO LIQD
1000.0000 mL | ORAL | Status: DC
  Administered 2020-02-10: 1000 mL

## 2020-02-10 MED ORDER — POLYETHYLENE GLYCOL 3350 17 G PO PACK: 17.0000 g | PACK | Freq: Every day | ORAL | Status: DC | PRN

## 2020-02-10 MED ORDER — MORPHINE BOLUS VIA INFUSION
5.0000 mg | INTRAVENOUS | Status: DC | PRN
  Filled 2020-02-10: qty 5

## 2020-02-10 MED ORDER — SODIUM CHLORIDE 0.9% IV SOLUTION: Freq: Once | INTRAVENOUS | Status: DC

## 2020-02-10 MED ORDER — POLYVINYL ALCOHOL 1.4 % OP SOLN
1.0000 [drp] | Freq: Four times a day (QID) | OPHTHALMIC | Status: DC | PRN
  Filled 2020-02-10: qty 15

## 2020-02-10 MED ORDER — SODIUM BICARBONATE 8.4 % IV SOLN
50.0000 meq | Freq: Once | INTRAVENOUS | Status: AC
  Administered 2020-02-10: 50 meq via INTRAVENOUS

## 2020-02-10 MED ORDER — DEXTROSE 50 % IV SOLN
INTRAVENOUS | Status: AC
  Filled 2020-02-10: qty 50

## 2020-02-10 MED ORDER — MORPHINE 100MG IN NS 100ML (1MG/ML) PREMIX INFUSION
0.0000 mg/h | INTRAVENOUS | Status: DC
  Administered 2020-02-10: 5 mg/h via INTRAVENOUS
  Filled 2020-02-10: qty 100

## 2020-02-10 MED ORDER — DOCUSATE SODIUM 50 MG/5ML PO LIQD: 100.0000 mg | Freq: Two times a day (BID) | ORAL | Status: DC

## 2020-02-10 MED FILL — Medication: Qty: 1 | Status: AC

## 2020-02-11 LAB — BPAM RBC
Blood Product Expiration Date: 202110012359
Blood Product Expiration Date: 202110082359
Blood Product Expiration Date: 202110132359
ISSUE DATE / TIME: 202109170623
ISSUE DATE / TIME: 202109170956
ISSUE DATE / TIME: 202109180200
Unit Type and Rh: 6200
Unit Type and Rh: 6200
Unit Type and Rh: 6200

## 2020-02-11 LAB — COMPREHENSIVE METABOLIC PANEL
ALT: 84 U/L — ABNORMAL HIGH (ref 0–44)
AST: 945 U/L — ABNORMAL HIGH (ref 15–41)
Albumin: 1.4 g/dL — ABNORMAL LOW (ref 3.5–5.0)
Alkaline Phosphatase: 585 U/L — ABNORMAL HIGH (ref 38–126)
Anion gap: 17 — ABNORMAL HIGH (ref 5–15)
BUN: 26 mg/dL — ABNORMAL HIGH (ref 8–23)
CO2: 19 mmol/L — ABNORMAL LOW (ref 22–32)
Calcium: 6.4 mg/dL — CL (ref 8.9–10.3)
Chloride: 102 mmol/L (ref 98–111)
Creatinine, Ser: 3.14 mg/dL — ABNORMAL HIGH (ref 0.61–1.24)
GFR calc Af Amer: 22 mL/min — ABNORMAL LOW (ref >60)
GFR calc non Af Amer: 19 mL/min — ABNORMAL LOW (ref >60)
Glucose, Bld: 68 mg/dL — ABNORMAL LOW (ref 70–99)
Potassium: 4.5 mmol/L (ref 3.5–5.1)
Sodium: 138 mmol/L (ref 135–145)
Total Bilirubin: 2.6 mg/dL — ABNORMAL HIGH (ref 0.3–1.2)
Total Protein: 4.6 g/dL — ABNORMAL LOW (ref 6.5–8.1)

## 2020-02-11 LAB — TYPE AND SCREEN
ABO/RH(D): A POS
Antibody Screen: NEGATIVE
Unit division: 0
Unit division: 0
Unit division: 0

## 2020-02-12 LAB — GLUCOSE, CAPILLARY: Glucose-Capillary: <10 mg/dL — CL (ref 70–99)

## 2020-02-23 NOTE — Accreditation Note (Signed)
Restraints not reported to CMS Pursuant to regulation 482.13 (G) (3) use of soft wrist restraints was logged on 02/12/2020.

## 2020-02-23 NOTE — Procedures (Signed)
Intubation Procedure Note  NISHANTH MCCAUGHAN  [id number]  [birth year]  Date:02-25-20  Time:4:00 AM   Provider Performing:Caasi Giglia R Timohty Renbarger    Procedure: Intubation (31500)  Indication(s) Respiratory Failure  Consent Risks of the procedure as well as the alternatives and risks of each were explained to the patient and/or caregiver.  Consent for the procedure was obtained and is signed in the bedside chart   Anesthesia none   Time Out Verified patient identification, verified procedure, site/side was marked, verified correct patient position, special equipment/implants available, medications/allergies/relevant history reviewed, required imaging and test results available.   Sterile Technique Usual hand hygeine, masks, and gloves were used   Procedure Description Patient positioned in bed supine.  Sedation given as noted above.  Patient was intubated with endotracheal tube using Glidescope.  View was Grade 1 full glottis .  Number of attempts was 1.  Colorimetric CO2 detector was consistent with tracheal placement.   Complications/Tolerance None; patient tolerated the procedure well. Chest X-ray is ordered to verify placement.   EBL Minimal   Specimen(s) None   Otilio Carpen Kaleisha Bhargava, PA-C

## 2020-02-23 NOTE — Progress Notes (Signed)
While hanging a morphine infusion, looked to monitor and noticed asystole on monitor. Spoke with sons at bedside. Ramonita Lab, RN came with me to bedside, no heart sounds or breathe sounds, pupils not reactive. Announced death at 2 by this RN and Ramonita Lab, RN.

## 2020-02-23 NOTE — Consult Note (Signed)
Consultation Note Date: 03/05/2020   Patient Name: Jason Good  DOB: Jan 15, 1951  MRN: [id number]  Age / Sex: 69 y.o., male  PCP: Lucia Gaskins, MD Referring Physician: Candee Furbish, MD  Reason for Consultation: Establishing goals of care and Psychosocial/spiritual support  HPI/Patient Profile: 69 y.o. male  with past medical history of HTN/HLD, history of prostate cancer with radiation treatment, anemia, glaucoma, cataract, osteoarthritis, low back pain, admitted on 02/13/2020 with significant renal failure with life-threatening hyperkalemia.   Clinical Assessment and Goals of Care: I have reviewed medical records including EPIC notes, labs and imaging, received report from bedside nursing staff and critical care attending, examined the patient.    Jason Good is intubated/ventilated and sedated.  He is acutely ill requiring vasopressor support and continuous HD.  He appears older than stated age, frail and thin.  There is no family at bedside at this time.   Conference with bedside nursing staff who share that sisters Jason Good and Jason Good will be arriving to the hospital in the next few hours.  They will get in touch with Jason Good sons.   Conference with CCM attending related to patient acute illness and treatment plan.    I return later to find that family has arrived, and after discussion with CCM, has elected DNR.   Jason Good continues to decline and dies on life support.   Bedside nursing staff provided compassionate care to patient and family.   Assisted with preparing Jason Good body for viewing by his family.    Support and encouragement to ICU nursing staff, naming their compassion for both patient and family.   HCPOA    NEXT OF KIN - sons and sisters, Jason Good and Jason Good.  Extended family present for end of life.    SUMMARY OF RECOMMENDATIONS   Comfort  care, let nature take it's course.   Code Status/Advance Care Planning:  DNR  Symptom Management:   Per CCM, no additional needs at this time.  Palliative Prophylaxis:   Frequent Pain Assessment, Oral Care and Turn Reposition  Additional Recommendations (Limitations, Scope, Preferences):  Full Comfort Care  Psycho-social/Spiritual:   Desire for further Chaplaincy support:no  Additional Recommendations: Caregiving  Support/Resources and Grief/Bereavement Support  Prognosis:   Hours - Days  Discharge Planning: Anticipated Hospital Death      Primary Diagnoses: Present on Admission: . ESRD (end stage renal disease) ([place]) . Acute renal failure (ARF) ([place])   I have reviewed the medical record, interviewed the patient and family, and examined the patient. The following aspects are pertinent.  Past Medical History:  Diagnosis Date  . Back pain   . High cholesterol   . Hypertension   . Prostate cancer [facility])    Social History   Socioeconomic History  . Marital status: Legally Separated    Spouse name: Not on file  . Number of children: Not on file  . Years of education: Not on file  . Highest education level: Not on file  Occupational History  .  Not on file  Tobacco Use  . Smoking status: Current Every Day Smoker    Types: Cigarettes  Substance and Sexual Activity  . Alcohol use: Yes    Comment: occasional  . Drug use: No  . Sexual activity: Not on file  Other Topics Concern  . Not on file  Social History Narrative  . Not on file   Social Determinants of Health   Financial Resource Strain:   . Difficulty of Paying Living Expenses: Not on file  Food Insecurity:   . Worried About Charity fundraiser in the Last Year: Not on file  . Ran Out of Food in the Last Year: Not on file  Transportation Needs:   . Lack of Transportation (Medical): Not on file  . Lack of Transportation (Non-Medical): Not on file  Physical Activity:   . Days of Exercise per  Week: Not on file  . Minutes of Exercise per Session: Not on file  Stress:   . Feeling of Stress : Not on file  Social Connections:   . Frequency of Communication with Friends and Family: Not on file  . Frequency of Social Gatherings with Friends and Family: Not on file  . Attends Religious Services: Not on file  . Active Member of Clubs or Organizations: Not on file  . Attends Archivist Meetings: Not on file  . Marital Status: Not on file   History reviewed. No pertinent family history. Scheduled Meds: . sodium chloride   Intravenous Once  . sodium chloride   Intravenous Once  . Chlorhexidine Gluconate Cloth  6 each Topical Daily  . dextrose      . docusate  100 mg Per Tube BID  . feeding supplement (PROSource TF)  45 mL Per Tube BID  . feeding supplement (VITAL HIGH PROTEIN)  1,000 mL Per Tube Q24H  . fentaNYL      . pantoprazole (PROTONIX) IV  40 mg Intravenous QHS  . [START ON 02/11/2020] polyethylene glycol  17 g Per Tube Daily  . sodium chloride flush  10-40 mL Intracatheter Q12H   Continuous Infusions: . sodium chloride    . sodium chloride    . sodium chloride 10 mL/hr at 2020-03-10 1200  . ceFEPime (MAXIPIME) IV Stopped (March 10, 2020 1049)  . norepinephrine (LEVOPHED) Adult infusion 21 mcg/min (03-10-20 1200)  . prismasol BGK 2/2.5 dialysis solution 2,000 mL/hr at 10-Mar-2020 0842  . prismasol BGK 2/2.5 replacement solution 500 mL/hr at March 10, 2020 0906  . prismasol BGK 2/2.5 replacement solution 300 mL/hr at 02/09/20 1922  . sodium chloride irrigation    . vasopressin 0.03 Units/min (10-Mar-2020 1200)   PRN Meds:.docusate, fentaNYL (SUBLIMAZE) injection, fentaNYL (SUBLIMAZE) injection, fentaNYL (SUBLIMAZE) injection, heparin, heparin, ondansetron (ZOFRAN) IV, oxyCODONE, polyethylene glycol, sodium chloride flush Medications Prior to Admission:  Prior to Admission medications   Medication Sig Start Date End Date Taking? Authorizing Provider  amLODipine (NORVASC) 10 MG  tablet Take 10 mg by mouth daily.    [provider]  aspirin EC 81 MG tablet Take 81 mg by mouth daily.    [provider]  labetalol (NORMODYNE) 200 MG tablet Take 200 mg by mouth 2 (two) times daily. 01/31/20   [provider]  lisinopril (ZESTRIL) 20 MG tablet Take 20 mg by mouth daily. 02/06/20   [provider]  naproxen (NAPROSYN) 500 MG tablet Take 500 mg by mouth 2 (two) times daily with a meal.    [provider]  oxyCODONE (OXY IR/ROXICODONE) 5 MG  immediate release tablet Take 5 mg by mouth 3 (three) times daily. 01/31/20   [provider]  rosuvastatin (CRESTOR) 10 MG tablet Take 10 mg by mouth daily.    [provider]   No Known Allergies Review of Systems  Unable to perform ROS: Intubated    Physical Exam Vitals and nursing note reviewed.  Constitutional:      General: He is not in acute distress.    Appearance: He is ill-appearing.  HENT:     Head: Normocephalic and atraumatic.     Comments: Some temporal wasting  Cardiovascular:     Rate and Rhythm: Normal rate.  Pulmonary:     Comments: Intubated/ventilated, sedated Abdominal:     General: Abdomen is flat. There is no distension.  Musculoskeletal:        General: No swelling.  Skin:    General: Skin is warm and dry.  Neurological:     Comments: Intubated/ventilated, sedated  Psychiatric:     Comments: Sedated     Vital Signs: BP (!) 165/73   Pulse 79   Temp (!) 96.3 F (35.7 C) (Axillary)   Resp (!) 21   Ht 5\' 6"  (1.676 m)   Wt 56.7 kg   SpO2 99%   BMI 20.18 kg/m  Pain Scale: CPOT   Pain Score: Asleep   SpO2: SpO2: 99 % O2 Device:SpO2: 99 % O2 Flow Rate: .O2 Flow Rate (L/min): 8 L/min  IO: Intake/output summary:   Intake/Output Summary (Last 24 hours) at 2020/03/05 1217 Last data filed at March 05, 2020 1200 Gross per 24 hour  Intake 19222.4 ml  Output 20585 ml  Net -1362.6 ml    LBM: Last BM Date: 02/09/20 Baseline Weight:  Weight: 55.8 kg Most recent weight: Weight: 56.7 kg     Palliative Assessment/Data:   Flowsheet Rows     Most Recent Value  Intake Tab  Referral Department Critical care  Unit at Time of Referral ICU  Palliative Care Primary Diagnosis Nephrology  Date Notified 03-05-2020  Palliative Care Type New Palliative care  Reason for referral Clarify Goals of Care  Date of Admission 02/09/2020  Date first seen by Palliative Care 03-05-20  # of days Palliative referral response time 0 Day(s)  # of days IP prior to Palliative referral 2  Clinical Assessment  Palliative Performance Scale Score 10%  Pain Max last 24 hours Not able to report  Pain Min Last 24 hours Not able to report  Dyspnea Max Last 24 Hours Not able to report  Dyspnea Min Last 24 hours Not able to report  Psychosocial & Spiritual Assessment  Palliative Care Outcomes      Time In: 1330  Time Out: 1420 Time Total: 50 minutes  Greater than 50%  of this time was spent counseling and coordinating care related to the above assessment and plan.  Signed by: Drue Novel, NP   Please contact Palliative Medicine Team phone at [phone] for questions and concerns.  For individual provider: See Shea Evans

## 2020-02-23 NOTE — Progress Notes (Signed)
[facility] Progress Note  Name: Jason Good MRN: [id number] DOB: [birth year]   Subjective:  Intubated overnight On levo at 22 mcg/min and vasopressin. Labile BPs. Lactate 10.6.  Hypoglycemic.  Review of systems:  Unable to obtain, intubated. ------------ Background on consult:  Jason Good is a 69 y.o. male with past medical history significant for hypertension, HLD, osteoarthritis, presented to [facility] ER for shortness of breath, weakness seen as a consultation for the evaluation of AKI and hyperkalemia.  Initially, the EMS found bradycardic to 71I and systolic blood pressure around 90s. In the ER, he was hypothermic to 93, SBP as low as 70s. The initial labs showed serum sodium of 130, potassium 7, CO2 10, BUN 84, creatinine level of 14.5, calcium 5.2, hemoglobin 7.5. The EKG consistent with peaked T wave. He was medically managed with normal saline bolus, insulin, dextrose, calcium gluconate, Lokelma, sodium bicarbonate. The chest x-ray consistent with mild cardiomegaly and pulmonary vascular congestion. Received IV Lasix to manage hyperkalemia. The repeat labs showed serum potassium level worsened to 7.3, BUN 99 and creatinine level 13.1. Discussed with the ER physician and decided to transfer the patient to [facility] for emergent renal replacement therapy. A right femoral temporary HD catheter was placed by [facility] ER physician.  The recent baseline serum creatinine level unknown. As per patient he was recently started on blood pressure medication by his PCP. He was feeling weak tired and not eating much. The home medications include lisinopril 20 mg, amlodipine, aspirin, labetalol, naproxen 500 mg twice a day, oxycodone and Crestor.  Intake/Output Summary (Last 24 hours) at March 06, 2020 1018 Last data filed at 2020/03/06 1000 Gross per 24 hour  Intake 19729.41 ml  Output 20868 ml  Net -1138.59 ml    Vitals:  Vitals:   03-06-20 0728 March 06, 2020 0800 03-06-20 0900 Mar 06, 2020  1000  BP:  113/74 90/67 91/73   Pulse:  69 75   Resp:  18 20 20   Temp:  (!) 96.3 F (35.7 C)    TempSrc:  Axillary    SpO2: 100% 100% 98%   Weight:      Height:         Physical Exam:  General intubated, sedated Neck supple trachea midline Lungs coarse BL Heart S1S2 no rub Abdomen soft/thin/nontender Extremities no edema  Access right fem nontunneled catheter   Medications reviewed   Labs:  BMP Latest Ref Rng & Units 2020-03-06 2020/03/06 06-Mar-2020  Glucose 70 - 99 mg/dL 68(L) - -  BUN 8 - 23 mg/dL 26(H) - -  Creatinine 0.61 - 1.24 mg/dL 3.14(H) - -  Sodium 135 - 145 mmol/L 138 138 137  Potassium 3.5 - 5.1 mmol/L 4.5 4.1 4.5  Chloride 98 - 111 mmol/L 102 - -  CO2 22 - 32 mmol/L 19(L) - -  Calcium 8.9 - 10.3 mg/dL 6.4(LL) - -     Assessment/Plan:   # AKI  - Baseline creatinine level unknown - may have some CKD.  AKI due to hypotension/shock with lisinopril concomitant with NSAIDs use, decreased oral intake versus progressive advanced CKD.  Renal US with increased echotexture and no hydro - Started on CRRT via right femoral nontunneled HD catheter on 9/17.   - Keep even as tolerated - K normal  # Severe life-threatening hyperkalemia with bradycardia and EKG changes in the setting of renal failure, lisinopril use: Status post medical treatment and now on CRRT  #Anion gap metabolic acidosis with renal failure: Started on CRRT.  Will stop bicarb  #  Hypocalcemia/CKD MBD: PTH 475.  hyperphosphatemia - on CRRT for now thus defer binder. IV calcium PRN  #Severe anemia related with advanced renal failure: 2u pRBC Friday.  Further evaluation per critical care  #Hypotensive/shock: . Currently on Levophed and vaso.    # Gross hematuria: urology following, on CBI  #HLD: CK level 2982 - hold statin    Dispo: prognosis appears extremely poor.  Palliative care is being involved.    Justin Mend, MD 02/28/2020 10:18 AM

## 2020-02-23 NOTE — Progress Notes (Signed)
NAME:  Jason Good, MRN:  [id number], DOB:  Oct 29, 1950, LOS: 2 ADMISSION DATE:  02/15/2020, CONSULTATION DATE:  01/24/2020 REFERRING MD:  Sabra Heck - [facility]  CHIEF COMPLAINT:  Weakness, dyspnea   Brief History   Jason Good is a 69 y.o. male who was admitted 9/16 with generalized weakness and found to have significant renal failure with life threatening hyperkalemia.  He was transferred to [facility] for emergent HD.  History of present illness   Jason Good is a 69 y.o. male who has a PMH including but not limited to HTN, HLD (see "past medical history" for rest).  He presented to [facility] ED 9/16 with weakness and dyspnea.  He was found to be bradycardic in the 30's and had SBP in 90's.    Workup revealed significant renal failure with life threatening hyperkalemia for which he received temporizing measures. Nephrology was consulted and recommended transfer to [facility] for emergent HD.  A trialysis catheter was placed in right femoral vein by EDP prior to transfer.  He stated that he had nausea and diarrhea for a week.  He did notice some dark stools 1 week ago which has since improved. Denies fevers/chills/sweats, headaches, chest pain, wheezing, abd pain, myalgias, exposures to known sick contacts.  Past Medical History  has HYPERLIPIDEMIA; ANEMIA-NOS; GLAUCOMA NOS; CATARACT NOS; HYPERTENSION; OSTEOARTHRITIS; LOW BACK PAIN; ESRD (end stage renal disease) ([place]); Acute renal failure ([place]); AKI (acute kidney injury) ([place]); Hyperkalemia; Acute renal failure (ARF) ([place]); and Acute respiratory failure ([place]) on their problem list.  Significant Hospital Events   9/16 > admit.  Consults:  Nephrology. Urology  Procedures:  R femoral HD cath 9/16 (EDP) >  CVL pending 9/16 >  Arterial line 9/16 > ETT 9/18 >  Significant Diagnostic Tests:  CXR 9/16 > vascular congestion.  Micro Data:  COVID 9/16 > neg.  Antimicrobials:  None.   Interim history/subjective:  Intubated last night for respiratory  arrest, bradycardia, hemodynamically unstable after.   Objective:  Blood pressure 125/75, pulse 82, temperature (!) 96.4 F (35.8 C), temperature source Axillary, resp. rate 19, height 5\' 6"  (1.676 m), weight 56.7 kg, SpO2 100 %.    Vent Mode: PRVC FiO2 (%):  [100 %] 100 % Set Rate:  [18 bmp] 18 bmp Vt Set:  [510 mL] 510 mL PEEP:  [5 cmH20] 5 cmH20 Plateau Pressure:  [14 cmH20] 14 cmH20   Intake/Output Summary (Last 24 hours) at Feb 23, 2020 0730 Last data filed at 02/23/20 0600 Gross per 24 hour  Intake 2445.95 ml  Output 1559 ml  Net 886.95 ml   Filed Weights   01/26/2020 1647 02/07/2020 2200  Weight: 55.8 kg 56.7 kg    Examination: GEN: frail elderly man on vent HEENT: ETT in place, minimal secretions CV: RRR, ext warm PULM: Clear, triggering vent GI: Soft, hypoactive BS EXT: No edema, +muscle wasting NEURO: opens eyes to voice, no motor response to pain PSYCH: RASS -1 SKIN: No rashes, foley in place with ongoing irrigation of clots  Liver function worsening   Assessment & Plan:  Acute kidney injury Hyperkalemia -On CRRT, net even -Continue to monitor BMP  Hypotension- ?vagal from bladder bleeding -Pressors to keep MAP greater than 65  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  - Hold AC - Bladder irrigation per urology - Trend H/H  Liver failure, extensive cancer to liver of unclear primary - Vitamin K x 1 - Trend LFTs, INR  AMS- eye movements seem a bit rhythmic, hypoglycemic, elevated lactic acid,  question seizures - Ativan x 1, check EEG, sedation PRN  Severe protein calorie malnutrition, present on admission -Nutrition consult when more stable  Frail elderly with metastatic cancer, liver/lung/GU/CNS/cardiac/renal failure.  >90% mortality - Attempting to find a POA, [place] is [employer].  Brother is apparently deceased - Palliative care consultation - Patient remains full code at this time  Best Practice:  Diet: TF Pain/Anxiety/Delirium protocol  (if indicated): N/A. VAP protocol (if indicated): in place DVT prophylaxis: SCD's only. GI prophylaxis: N/A. Glucose control:  Mobility: Bedrest. Code Status: Full. Family Communication: still trying to find Disposition: ICU.  The patient is critically ill with multiple organ systems failure and requires high complexity decision making for assessment and support, frequent evaluation and titration of therapies, application of advanced monitoring technologies and extensive interpretation of multiple databases. Critical Care Time devoted to patient care services described in this note independent of APP/resident time (if applicable)  is 46 minutes.   Erskine Emery MD [facility] 02-11-2020 8:27 AM Personal pager: [phone] If unanswered, please page CCM On-call: [phone]

## 2020-02-23 NOTE — Progress Notes (Signed)
PCCM interval progress note:  Pt had an episode of hypotension, hypoxemia and AMS, on bedside assessment oxygen saturations had recovered.  Pt still slightly agitated and disoriented, did not require emergent intubation, undergoing CBI with copious bloody irrigation, 1 unit PRBC's ordered and CXR pending.   Otilio Carpen Wyvonne Carda, PA-C

## 2020-02-23 NOTE — Progress Notes (Signed)
Wasted 96 mL morphine in stericycle with Daron Offer, RN

## 2020-02-23 NOTE — Progress Notes (Signed)
eLink Physician-Brief Progress Note Patient Name: [name] DOB: [birth year] MRN: [id number]   Date of Service  01-Mar-2020  HPI/Events of Note  Patient with hypotension, hypoxemia, altered mental status, PO2 43. Bloody foley bag drainage.  eICU Interventions  Stat labs ordered, patient placed on 100 % non rebreather mask, PCCM bedside asked to come and intubate patient, 250 ml 5 % Albumin iv bolus x 1 ordered, Norepinephrine and Vasopressin restarted, stat labs ordered.        Amra Shukla U Arlow Spiers 03/01/2020, 1:40 AM

## 2020-02-23 NOTE — Progress Notes (Signed)
EEG complete - results pending 

## 2020-02-23 NOTE — Progress Notes (Signed)
Pt had another episode of bradycardia, hypotension and AMS.  He was intubated without meds and vitals improved, head CT and repeat labs pending. POCUS without cardiac tamponade, EF appears adequate and no obvious thrombus. Attempted to reach pt's brother to update him without answer.    Otilio Carpen Arlee Santosuosso, PA-C

## 2020-02-23 NOTE — Progress Notes (Signed)
Remains obtunded, Bps dropping despite rapid titration of pressors. Discussed situation with family and they would like for him to be allowed to pass in peace.  Erskine Emery MD PCCM

## 2020-02-23 NOTE — Progress Notes (Signed)
Agonal respirations observed, began bagging patient with ambu bag. Dr. Lucile Shutters provider giving orders.  Order of events: 0339 bradycardic at 32 beats per minute 0340 Atropine 0.5 mg ordered/administered 0341 1 amp Bicarb ordered/administered 0342 Sinus tachycardia at 146 0342 Second amp Bicarb ordered/administered 7142- Ground team arrival, Mickel Baas Gleason PA-C  [phone]- Intubation, color change ETT 7.5 25cm at lip 0345- 15f OG placed by Marcelline Deist, RN

## 2020-02-23 NOTE — Progress Notes (Signed)
Initial Nutrition Assessment  RD working remotely.  DOCUMENTATION CODES:   Not applicable  INTERVENTION:  - will adjust TF regimen: Vital High Protein @ 55 ml/hr with 45 ml Prosource TF BID which will provide 1400 kcal (105% estimated kcal need), 137 grams protein, and 1103 ml free water.  - free water flush, if desired, to be per CCM or Nephrology.  - will complete NFPE at follow-up.  NUTRITION DIAGNOSIS:   Inadequate oral intake related to inability to eat as evidenced by NPO status.  GOAL:   Patient will meet greater than or equal to 90% of their needs  MONITOR:   Vent status, TF tolerance, Labs, Weight trends, I & O's  REASON FOR ASSESSMENT:   Ventilator, Consult Enteral/tube feeding initiation and management  ASSESSMENT:   69 y.o. male with medical history of HTN, HLD, prostate cancer, and chronic back pain. He [facility] to [facility] ED on 9/16 with weakness, dyspnea, and N/D x1 week. Workup showed renal failure with life-threatening hyperkalemia. Nephrology was consulted and recommended transfer to [facility] for emergent HD.  Patient is intubated with OGT which was placed today at 0348; no imaging documentation on the location of tip of tube.   He is currently receiving TF per protocol: Vital High Protein @ 40 ml/hr with 45 ml Prosource TF BID. This regimen is providing 1040 kcal, 106 grams protein, and 802 ml free water.   Weight on 9/16 was documented as 123 and 125 lb. PTA, the most recently documented weight was on 01/26/13 when he weighed 155 lb. No information documented yet in the edema section of flow sheet.  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  Medications reviewed; 100 mg colace BID, 40 mg IV protonix/day, 17 g miralax/day.     NUTRITION - FOCUSED PHYSICAL EXAM:  unable to complete at this time.   Diet Order:   Diet Order            Diet NPO time specified  Diet effective now                 EDUCATION NEEDS:   Not appropriate for education at this time  Skin:  Skin Assessment: Reviewed RN Assessment  Last BM:  9/17 (type 4 x2)  Height:   Ht Readings from Last 1 Encounters:  02/06/2020 5\' 6"  (1.676 m)    Weight:   Wt Readings from Last 1 Encounters:  02/04/2020 56.7 kg     Estimated Nutritional Needs:  Kcal:  1333 kcal Protein:  125-142 grams Fluid:  >/= 1.8 L/day      Jarome Matin, MS, RD, LDN, CNSC Inpatient Clinical Dietitian RD pager # available in [facility]  After hours/weekend pager # available in [facility]

## 2020-02-23 NOTE — Death Summary Note (Addendum)
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 year old male who was admitted with severe hyperkalemia requiring emergent hemodialysis.  On hospital day 2 developed worsening encephalopathy and hematuria requiring continuous bladder irritation.  Overnight hospital day 2-3 developed worsening respiratory status requiring emergent intubation.  After intubation went into multiorgan failure.  Workup for his hematuria incidentally noted extensive metastatic disease to liver.  In light of his frail baseline health, newly found stage 4 cancer, and worsening multiorgan failure, family decided to allow him to pass away in peace.  Pertinent Labs and Studies  Significant Diagnostic Studies CT ABDOMEN PELVIS WO CONTRAST  Addendum Date: 02/09/2020   ADDENDUM REPORT: 02/09/2020 17:16 ADDENDUM: These results were called by telephone at the time of interpretation on 02/09/2020 at 4:33pm to provider RN Shea Stakes on 13M, who verbally acknowledged these results. Electronically Signed   By: Iven Finn M.D.    On: 02/09/2020 17:16   Result Date: 02/09/2020 CLINICAL DATA:  Cancer of unknown primary, staging metastatic disease. Abdominal trauma, gross hematuria. Acute renal failure. Hypertension, cholesterol. Admitted last night with respiratory distress. Also bloody stools X 1 day. EXAM: CT CHEST, ABDOMEN AND PELVIS WITHOUT CONTRAST TECHNIQUE: Multidetector CT imaging of the chest, abdomen and pelvis was performed following the standard protocol without IV contrast. COMPARISON:  None. FINDINGS: CT CHEST FINDINGS Lines and tubes: Left subclavian central venous catheter with tip terminating in the proximal azygos vein. Cardiovascular: Normal heart size. No significant pericardial effusion. The thoracic aorta is normal in caliber. Mild atherosclerotic plaque of the thoracic aorta. At least 3 vessel mild coronary artery calcifications. Mediastinum/Nodes: No enlarged mediastinal or axillary lymph nodes. No gross hilar adenopathy, noting limited sensitivity for the detection of hilar adenopathy on this noncontrast study. thyroid gland, trachea, and esophagus demonstrate no significant findings. Lungs/Pleura: Right middle lobe linear atelectasis versus scarring. Lingular linear atelectasis versus scarring. Micronodule within the right lower lobe (9:45). Vague no pulmonary mass. Right lower lobe ground-glass airspace opacity measuring up to 8 mm. Bilateral trace pleural effusions, right greater than left, with associated bilateral lower lobe compressive atelectasis. No pneumothorax. Musculoskeletal: Marked diffuse subcutaneus soft tissue edema. No chest wall abnormality No suspicious lytic or blastic osseous lesions. No acute displaced fracture. Multilevel degenerative changes of the spine. CT ABDOMEN PELVIS FINDINGS Hepatobiliary: Innumerable vague, extremely difficult to discern on noncontrast study, hypodense cannonball appearing hepatic lesions within as an example a 4.4 cm lesion (3:25). Gallbladder is grossly unremarkable.  No definite biliary ductal dilatation. Pancreas: Unremarkable. No pancreatic ductal dilatation or surrounding inflammatory changes. Spleen: Normal in size without focal abnormality. Adrenals/Urinary Tract: No adrenal  nodule bilaterally. No nephrolithiasis, no hydronephrosis, and no contour-deforming renal mass. No ureterolithiasis or hydroureter. Layering fluid-fluid level with hyperdense fluid inferiorly measuring up to 56 Hounsfield units in suggestive of blood products. Foci of gas within the urinary bladder lumen likely related to Foley catheter with tip and inflated balloon noted to terminate within the urinary bladder lumen. Stomach/Bowel: PO contrast reaches the proximal to mid small bowel. Stomach is within normal limits. Appendix appears normal. No evidence of small bowel wall thickening, distention, or inflammatory changes. Suggestion of mild circumferential bowel wall thickening likely related to ascites and fluid overload. Vascular/Lymphatic: No abdominal aorta or iliac aneurysm. Moderate to severe atherosclerotic plaque of the aorta and its branches. No abdominal, pelvic, or inguinal lymphadenopathy. s. Reproductive: Prostate is not visualized with metallic densities in its place likely representing radiation therapy beads. Other: Small volume simple free fluid ascites within the abdomen and pelvis. No intraperitoneal free gas. No organized fluid collection. Musculoskeletal: Marked diffuse subcutaneus soft tissue edema. No abdominal wall hernia or abnormality No suspicious lytic or blastic osseous lesions. No acute displaced fracture. Multilevel mild degenerative changes of the spine. IMPRESSION: 1. Please note markedly limited evaluation on this noncontrast study. 2. Blood products within the urinary bladder lumen. May be related to underlying malignancy versus trauma versus less likely hemorrhagic cystitis given lack of inflammatory changes. 3. Innumerable poorly defined and incompletely evaluated  hypodense hepatic lesions suggestive of metastases versus primary liver malignancy. 4. Findings suggestive of fluid overload with bilateral trace pleural effusions, small volume ascites, anasarca. 5. Left subclavian central venous catheter with tip terminating in the proximal azygos vein. 6. Indeterminate 72mm right lower lobe ground-glass airspace opacity. Attention on follow-up. 7. No acute displaced fracture. Electronically Signed: By: Iven Finn M.D. On: 02/09/2020 15:55   CT HEAD WO CONTRAST  Result Date: 02/09/2020 CLINICAL DATA:  Mental status change, unknown cause metastatic disease. Hypertension, high cholesterol. Admitted with respiratory distress last night. EXAM: CT HEAD WITHOUT CONTRAST TECHNIQUE: Contiguous axial images were obtained from the base of the skull through the vertex without intravenous contrast. COMPARISON:  None. FINDINGS: Brain: No evidence of large-territorial acute infarction. No parenchymal hemorrhage. Extremely vague hyperdensity within the right insular region likely artifactual (3:17). No mass lesion. No extra-axial collection. No mass effect or midline shift. No hydrocephalus. Basilar cisterns are patent. Vascular: No hyperdense vessel or unexpected calcification. Skull: Normal. Negative for fracture or focal lesion. Sinuses/Orbits: No acute finding. Other: None. IMPRESSION: No acute intracranial abnormality. Electronically Signed   By: Iven Finn M.D.   On: 02/09/2020 15:30   CT CHEST WO CONTRAST  Addendum Date: 02/09/2020   ADDENDUM REPORT: 02/09/2020 17:16 ADDENDUM: These results were called by telephone at the time of interpretation on 02/09/2020 at 4:33pm to provider RN Shea Stakes on 62M, who verbally acknowledged these results. Electronically Signed   By: Iven Finn M.D.   On: 02/09/2020 17:16   Result Date: 02/09/2020 CLINICAL DATA:  Cancer of unknown primary, staging metastatic disease. Abdominal trauma, gross hematuria. Acute renal failure. Hypertension,  cholesterol. Admitted last night with respiratory distress. Also bloody stools X 1 day. EXAM: CT CHEST, ABDOMEN AND PELVIS WITHOUT CONTRAST TECHNIQUE: Multidetector CT imaging of the chest, abdomen and pelvis was performed following the standard protocol without IV contrast. COMPARISON:  None. FINDINGS: CT CHEST FINDINGS Lines and tubes: Left subclavian central venous catheter with tip terminating in the proximal azygos vein. Cardiovascular: Normal heart size. No significant pericardial effusion. The thoracic aorta is normal in caliber. Mild  atherosclerotic plaque of the thoracic aorta. At least 3 vessel mild coronary artery calcifications. Mediastinum/Nodes: No enlarged mediastinal or axillary lymph nodes. No gross hilar adenopathy, noting limited sensitivity for the detection of hilar adenopathy on this noncontrast study. thyroid gland, trachea, and esophagus demonstrate no significant findings. Lungs/Pleura: Right middle lobe linear atelectasis versus scarring. Lingular linear atelectasis versus scarring. Micronodule within the right lower lobe (9:45). Vague no pulmonary mass. Right lower lobe ground-glass airspace opacity measuring up to 8 mm. Bilateral trace pleural effusions, right greater than left, with associated bilateral lower lobe compressive atelectasis. No pneumothorax. Musculoskeletal: Marked diffuse subcutaneus soft tissue edema. No chest wall abnormality No suspicious lytic or blastic osseous lesions. No acute displaced fracture. Multilevel degenerative changes of the spine. CT ABDOMEN PELVIS FINDINGS Hepatobiliary: Innumerable vague, extremely difficult to discern on noncontrast study, hypodense cannonball appearing hepatic lesions within as an example a 4.4 cm lesion (3:25). Gallbladder is grossly unremarkable. No definite biliary ductal dilatation. Pancreas: Unremarkable. No pancreatic ductal dilatation or surrounding inflammatory changes. Spleen: Normal in size without focal abnormality.  Adrenals/Urinary Tract: No adrenal nodule bilaterally. No nephrolithiasis, no hydronephrosis, and no contour-deforming renal mass. No ureterolithiasis or hydroureter. Layering fluid-fluid level with hyperdense fluid inferiorly measuring up to 56 Hounsfield units in suggestive of blood products. Foci of gas within the urinary bladder lumen likely related to Foley catheter with tip and inflated balloon noted to terminate within the urinary bladder lumen. Stomach/Bowel: PO contrast reaches the proximal to mid small bowel. Stomach is within normal limits. Appendix appears normal. No evidence of small bowel wall thickening, distention, or inflammatory changes. Suggestion of mild circumferential bowel wall thickening likely related to ascites and fluid overload. Vascular/Lymphatic: No abdominal aorta or iliac aneurysm. Moderate to severe atherosclerotic plaque of the aorta and its branches. No abdominal, pelvic, or inguinal lymphadenopathy. s. Reproductive: Prostate is not visualized with metallic densities in its place likely representing radiation therapy beads. Other: Small volume simple free fluid ascites within the abdomen and pelvis. No intraperitoneal free gas. No organized fluid collection. Musculoskeletal: Marked diffuse subcutaneus soft tissue edema. No abdominal wall hernia or abnormality No suspicious lytic or blastic osseous lesions. No acute displaced fracture. Multilevel mild degenerative changes of the spine. IMPRESSION: 1. Please note markedly limited evaluation on this noncontrast study. 2. Blood products within the urinary bladder lumen. May be related to underlying malignancy versus trauma versus less likely hemorrhagic cystitis given lack of inflammatory changes. 3. Innumerable poorly defined and incompletely evaluated hypodense hepatic lesions suggestive of metastases versus primary liver malignancy. 4. Findings suggestive of fluid overload with bilateral trace pleural effusions, small volume  ascites, anasarca. 5. Left subclavian central venous catheter with tip terminating in the proximal azygos vein. 6. Indeterminate 27mm right lower lobe ground-glass airspace opacity. Attention on follow-up. 7. No acute displaced fracture. Electronically Signed: By: Iven Finn M.D. On: 02/09/2020 15:55   US RENAL  Result Date: 02/09/2020 CLINICAL DATA:  Acute renal insufficiency EXAM: RENAL / URINARY TRACT ULTRASOUND COMPLETE COMPARISON:  None. FINDINGS: Right Kidney: Renal measurements: 11.5 x 4.8 by 4.0 cm = volume: 117 mL. Increased renal cortical echotexture. No nephrolithiasis or hydronephrosis. Left Kidney: Renal measurements: 11.9 x 5.2 by 4.6 cm = volume: 149 mL. Increased renal cortical echotexture. No nephrolithiasis or hydronephrosis. Bladder: There is a large amount of debris within the bladder, likely related to hematuria. Prevoid volume measures 252 cc. The patient is unable to void, and there is an indwelling Foley catheter identified. Other: Trace ascites is identified.  Small right pleural effusion incidentally noted. IMPRESSION: 1. Increased renal cortical echotexture consistent with medical renal disease. 2. Large amount of debris layering dependently within the bladder, likely related to blood products. Please correlate with urinalysis. 3. Incidental small right pleural effusion and trace ascites. Electronically Signed   By: Randa Ngo M.D.   On: 02/09/2020 01:16   DG CHEST PORT 1 VIEW  Result Date: 03-08-20 CLINICAL DATA:  Intubation EXAM: PORTABLE CHEST 1 VIEW COMPARISON:  03/08/2020 FINDINGS: Unchanged position of left subclavian catheter. Endotracheal tube tip is just below the level of the clavicular heads. Esophageal catheter tip is below the field of view. No focal airspace consolidation. No pleural abnormality. IMPRESSION: Endotracheal tube tip just below the level of the clavicular heads. Electronically Signed   By: Ulyses Jarred M.D.   On: 03/08/20 04:10   DG Chest  Port 1 View  Result Date: 2020-03-08 CLINICAL DATA:  Respiratory failure EXAM: PORTABLE CHEST 1 VIEW COMPARISON:  01/28/2020 FINDINGS: There is mild right basilar atelectasis. The lungs are otherwise clear no pneumothorax. Tiny right pleural effusion has developed, new since prior examination. Cardiac size within normal limits. Left subclavian chest port with its tip likely oriented within the terminal azygos vein is unchanged. Cardiac size within normal limits. Pulmonary vascularity is normal. IMPRESSION: Interval development of tiny right pleural effusion with associated right basilar atelectasis. Electronically Signed   By: Fidela Salisbury MD   On: 2020/03/08 02:53   DG CHEST PORT 1 VIEW  Result Date: 02/21/2020 CLINICAL DATA:  Central line placement EXAM: PORTABLE CHEST 1 VIEW COMPARISON:  02/11/2020 FINDINGS: Left subclavian approach central venous catheter tip projects over the lower SVC. The tip appears to be oriented either anteriorly or posteriorly. Bibasilar atelectasis.  Normal pleural spaces. IMPRESSION: Left subclavian approach central venous catheter tip projecting over the lower SVC. Electronically Signed   By: Ulyses Jarred M.D.   On: 01/30/2020 22:21   DG Chest Port 1 View  Result Date: 02/02/2020 CLINICAL DATA:  Weakness and shortness of breath EXAM: PORTABLE CHEST 1 VIEW COMPARISON:  September 17, 2007 FINDINGS: The heart size and mediastinal contours are mildly enlarged. Aortic knob calcifications are seen. There is prominence of the central pulmonary vasculature. No acute osseous abnormality. IMPRESSION: Mild cardiomegaly and pulmonary vascular congestion. Electronically Signed   By: Prudencio Pair M.D.   On: 01/31/2020 17:35   ECHOCARDIOGRAM COMPLETE  Result Date: 02/09/2020    ECHOCARDIOGRAM REPORT   Patient Name:   Lenna Gilford Date of Exam: 02/09/2020 Medical Rec #:  [id number]          Height:       66.0 in Accession #:    [id number]         Weight:       125.0 lb Date of Birth:   1950/11/16          BSA:          1.638 m Patient Age:    73 years           BP:           119/55 mmHg Patient Gender: M                  HR:           72 bpm. Exam Location:  Inpatient Procedure: 2D Echo, Cardiac Doppler and Color Doppler Indications:    Pericardial effusion  History:        Patient has no prior  history of Echocardiogram examinations.                 Risk Factors:Hypertension, Dyslipidemia and Current Smoker.                 ESRD.  Sonographer:    Clayton Lefort RDCS (AE) Referring Phys: [place]  1. Left ventricular ejection fraction, by estimation, is 65 to 70%. The left ventricle has normal function. The left ventricle has no regional wall motion abnormalities. There is mild concentric left ventricular hypertrophy. Left ventricular diastolic parameters were normal.  2. Right ventricular systolic function is normal. The right ventricular size is normal. There is normal pulmonary artery systolic pressure. The estimated right ventricular systolic pressure is 48.5 mmHg.  3. Left atrial size was mildly dilated.  4. A small pericardial effusion is present. The pericardial effusion is circumferential. There is no evidence of cardiac tamponade.  5. The mitral valve is grossly normal. Trivial mitral valve regurgitation. No evidence of mitral stenosis.  6. The aortic valve is tricuspid. There is mild calcification of the aortic valve. There is mild thickening of the aortic valve. Aortic valve regurgitation is mild. Mild aortic valve sclerosis is present, with no evidence of aortic valve stenosis.  7. The inferior vena cava is normal in size with greater than 50% respiratory variability, suggesting right atrial pressure of 3 mmHg. FINDINGS  Left Ventricle: Left ventricular ejection fraction, by estimation, is 65 to 70%. The left ventricle has normal function. The left ventricle has no regional wall motion abnormalities. The left ventricular internal cavity size was normal in  size. There is  mild concentric left ventricular hypertrophy. Left ventricular diastolic parameters were normal. Right Ventricle: The right ventricular size is normal. No increase in right ventricular wall thickness. Right ventricular systolic function is normal. There is normal pulmonary artery systolic pressure. The tricuspid regurgitant velocity is 2.19 m/s, and  with an assumed right atrial pressure of 3 mmHg, the estimated right ventricular systolic pressure is 46.2 mmHg. Left Atrium: Left atrial size was mildly dilated. Right Atrium: Right atrial size was normal in size. Pericardium: A small pericardial effusion is present. The pericardial effusion is circumferential. There is no evidence of cardiac tamponade. Mitral Valve: The mitral valve is grossly normal. There is mild thickening of the anterior and posterior mitral valve leaflet(s). Trivial mitral valve regurgitation. No evidence of mitral valve stenosis. Tricuspid Valve: The tricuspid valve is grossly normal. Tricuspid valve regurgitation is trivial. No evidence of tricuspid stenosis. Aortic Valve: The aortic valve is tricuspid. There is mild calcification of the aortic valve. There is mild thickening of the aortic valve. Aortic valve regurgitation is mild. Aortic regurgitation PHT measures 759 msec. Mild aortic valve sclerosis is present, with no evidence of aortic valve stenosis. Aortic valve mean gradient measures 3.0 mmHg. Aortic valve peak gradient measures 8.8 mmHg. Aortic valve area, by VTI measures 2.83 cm. Pulmonic Valve: The pulmonic valve was grossly normal. Pulmonic valve regurgitation is not visualized. No evidence of pulmonic stenosis. Aorta: The aortic root and ascending aorta are structurally normal, with no evidence of dilitation. Venous: The inferior vena cava is normal in size with greater than 50% respiratory variability, suggesting right atrial pressure of 3 mmHg. IAS/Shunts: The atrial septum is grossly normal.  LEFT VENTRICLE PLAX  2D LVIDd:         4.20 cm  Diastology LVIDs:         2.70 cm  LV e' medial:  10.90 cm/s LV PW:         1.50 cm  LV E/e' medial:  7.9 LV IVS:        1.00 cm  LV e' lateral:   13.20 cm/s LVOT diam:     2.10 cm  LV E/e' lateral: 6.5 LV SV:         80 LV SV Index:   49 LVOT Area:     3.46 cm  RIGHT VENTRICLE             IVC RV Basal diam:  2.90 cm     IVC diam: 1.50 cm RV S prime:     13.20 cm/s TAPSE (M-mode): 1.9 cm LEFT ATRIUM             Index       RIGHT ATRIUM           Index LA diam:        2.90 cm 1.77 cm/m  RA Area:     16.20 cm LA Vol (A2C):   61.2 ml 37.37 ml/m RA Volume:   42.50 ml  25.95 ml/m LA Vol (A4C):   50.5 ml 30.84 ml/m LA Biplane Vol: 58.8 ml 35.90 ml/m  AORTIC VALVE AV Area (Vmax):    2.81 cm AV Area (Vmean):   2.84 cm AV Area (VTI):     2.83 cm AV Vmax:           148.00 cm/s AV Vmean:          83.500 cm/s AV VTI:            0.281 m AV Peak Grad:      8.8 mmHg AV Mean Grad:      3.0 mmHg LVOT Vmax:         120.00 cm/s LVOT Vmean:        68.400 cm/s LVOT VTI:          0.230 m LVOT/AV VTI ratio: 0.82 AI PHT:            759 msec  AORTA Ao Root diam: 3.40 cm Ao Asc diam:  3.40 cm MITRAL VALVE               TRICUSPID VALVE MV Area (PHT): 3.17 cm    TR Peak grad:   19.2 mmHg MV Decel Time: 239 msec    TR Vmax:        219.00 cm/s MV E velocity: 86.30 cm/s MV A velocity: 53.80 cm/s  SHUNTS MV E/A ratio:  1.60        Systemic VTI:  0.23 m                            Systemic Diam: 2.10 cm Eleonore Chiquito MD Electronically signed by Eleonore Chiquito MD Signature Date/Time: 02/09/2020/10:59:39 AM    Final     Microbiology Recent Results (from the past 240 hour(s))  SARS Coronavirus 2 by RT PCR (hospital order, performed in [place] hospital lab) Nasopharyngeal Nasopharyngeal Swab     Status: None   Collection Time: 02/07/2020  5:07 PM   Specimen: Nasopharyngeal Swab  Result Value Ref Range Status   SARS Coronavirus 2 NEGATIVE NEGATIVE Final    Comment: (NOTE) SARS-CoV-2 target nucleic acids  are NOT DETECTED.  The SARS-CoV-2 RNA is generally detectable in upper and lower respiratory specimens during the acute phase of infection. The lowest concentration of SARS-CoV-2 viral copies this assay  can detect is 250 copies / mL. A negative result does not preclude SARS-CoV-2 infection and should not be used as the sole basis for treatment or other patient management decisions.  A negative result may occur with improper specimen collection / handling, submission of specimen other than nasopharyngeal swab, presence of viral mutation(s) within the areas targeted by this assay, and inadequate number of viral copies (<250 copies / mL). A negative result must be combined with clinical observations, patient history, and epidemiological information.  Fact Sheet for Patients:   StrictlyIdeas.no  Fact Sheet for Healthcare Providers: BankingDealers.co.za  This test is not yet approved or  cleared by the [place] FDA and has been authorized for detection and/or diagnosis of SARS-CoV-2 by FDA under an Emergency Use Authorization (EUA).  This EUA will remain in effect (meaning this test can be used) for the duration of the COVID-19 declaration under Section 564(b)(1) of the Act, 21 U.S.C. section 360bbb-3(b)(1), unless the authorization is terminated or revoked sooner.  Performed at [facility], [address], [place], [place] [address]     Lab Basic Metabolic Panel: Recent Labs  Lab 02/06/2020 1700 02/06/2020 1700 02/13/2020 1904 02/12/2020 1907 02/04/2020 1907 02/09/20 0312 02/09/20 0312 02/09/20 1753 02-24-20 0133 2020-02-24 0135 2020-02-24 0310 Feb 24, 2020 0430 2020/02/24 0511 24-Feb-2020 0851  NA 133*   < >  --  134*   < > 135   < > 137 137  --  137 138 138  --   K 7.3*   < >  --  5.9*   < > 5.8*   < > 4.6 4.7  --  4.5 4.1 4.5  --   CL 99  --   --  104  --  102  --  102  --   --   --   --  102  --   CO2 10*  --   --   --   --  13*  --   16*  --   --   --   --  19*  --   GLUCOSE 109*  --   --  140*  --  140*  --  95  --   --   --   --  68*  --   BUN 99*  --   --  80*  --  79*  --  42*  --   --   --   --  26*  --   CREATININE 13.10*  --   --  14.30*  --  9.25*  --  5.36*  --   --   --   --  3.14*  --   CALCIUM 5.2*  --   --   --   --  5.2*  --  6.1*  --   --   --   --  6.4*  --   MG 2.6*  --   --   --   --  2.7*  --   --   --  2.8*  --   --   --  2.9*  PHOS  --   --  15.2*  --   --  11.1*  --  7.5*  --   --   --   --  6.7* 6.1*   < > = values in this interval not displayed.   Liver Function Tests: Recent Labs  Lab 02/02/2020 1700 02/09/20 0312 02/09/20 1753 February 24, 2020 0511  AST 370*  --   --  945*  ALT 43  --   --  84*  ALKPHOS 391*  --   --  585*  BILITOT 3.6*  --   --  2.6*  PROT 5.8*  --   --  4.6*  ALBUMIN 1.7* 1.3* 1.4* 1.4*   No results for input(s): LIPASE, AMYLASE in the last 168 hours. Recent Labs  Lab March 11, 2020 0511  AMMONIA 57*   CBC: Recent Labs  Lab 01/30/2020 1700 02/09/2020 1907 02/09/20 0312 02/09/20 1451 2020/03/11 0020 11-Mar-2020 0133 March 11, 2020 0135 03-11-20 0310 2020/03/11 0430  WBC 16.9*  --  17.4*  --   --   --  15.3*  --   --   NEUTROABS 13.3*  --   --   --   --   --  13.1*  --   --   HGB 7.4*   < > 6.0*   < > 8.3* 8.5* 8.1* 7.8* 8.8*  HCT 21.5*   < > 17.1*   < > 23.9* 25.0* 23.5* 23.0* 26.0*  MCV 71.0*  --  67.6*  --   --   --  73.2*  --   --   PLT 403*  --  389  --   --   --  227  --   --    < > = values in this interval not displayed.   Cardiac Enzymes: Recent Labs  Lab 01/31/2020 2205  CKTOTAL 2,982*   Sepsis Labs: Recent Labs  Lab 02/02/2020 1700 02/09/2020 2205 02/13/2020 2205 02/09/20 0149 02/09/20 0312 Mar 11, 2020 0135 Mar 11, 2020 0427 03-11-2020 0851 03/11/2020 1142  PROCALCITON  --  26.82  --   --   --   --   --   --   --   WBC 16.9*  --   --   --  17.4* 15.3*  --   --   --   LATICACIDVEN  --  2.0*   < > 2.4*  --   --  6.5* 10.6* >11.0*   < > = values in this interval not displayed.      Candee Furbish 2020/03/11, 2:16 PM

## 2020-02-23 NOTE — Progress Notes (Signed)
eLink Physician-Brief Progress Note Patient Name: Jason Good DOB: 1950/11/14 MRN: [id number]   Date of Service  Feb 28, 2020  HPI/Events of Note  Patient with apnea and bradycardia down into the 30's.  eICU Interventions  Atropine 0.5 mg iv x 1, patient ventilated via Ambu bag, then intubated by respiratory therapy.  Two amps of sodium bicarbonate were also given at the time of marked bradycardia to immediately counteract the impact of  His PH of 7.14. PCCM arrived shortly after intubation.        [name] [name] 2020-02-28, 3:46 AM

## 2020-02-23 NOTE — Progress Notes (Signed)
Subjective/Chief Complaint:   1 - Acute Renal Failure - Cr 13 up from 1.2 2008 on eval weakness / malaise. Renal US and CT withotu hydro or significant parenchymal thinning. Also hypotensive with high PCT c/w likelyi sepsis.   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 No large GU masses by CT. H/o pelvic radiation. Replaced with 5F 3 way with 30cc in balloon on light traction and irrigation 9/17.   3 - Prostate Cancer - s/p external beam radiation for unknown grade / stage disease. Prostate radiation markers present by Ct 01/2020. PSA 0.13 c/w excellent disease control.   4 - Liver Lesions / Likely Metastatic Cancer - diffuse hepatic lesions by CT 01/2020. CT chest OK. No pelvic adenopathy.  Distribution most c/w GI v. hepatic primary.    Today " Jason Good" is deteriorating. Labs still with severe metabolic derangement, intubated overnight.    Objective: Vital signs in last 24 hours: Temp:  [90.3 F (32.4 C)-97.2 F (36.2 C)] 96.4 F (35.8 C) (09/18 0636) Pulse Rate:  [57-82] 82 (09/18 0500) Resp:  [0-25] 19 (09/18 0500) BP: (93-145)/(55-93) 125/75 (09/18 0500) SpO2:  [89 %-100 %] 100 % (09/18 0500) Arterial Line BP: (101-210)/(37-59) 122/59 (09/18 0500) FiO2 (%):  [100 %] 100 % (09/18 0346) Last BM Date:  (PTA)  Intake/Output from previous day: 09/17 0701 - 09/18 0700 In: 2446 [I.V.:608.7; Blood:1057.7; IV Piggyback:779.6] Out: 1559 [Urine:500] Intake/Output this shift: Total I/O In: 611.1 [I.V.:196.1; Blood:315; IV Piggyback:100] Out: 493 [Urine:500]  General appearance: thin, ill-appearing, now intubated GCS 3T Eyes: negative Throat: ETT i place Resp: non-coarse on vent Cardio: sinus by monitor GI: soft, non-tender; bowel sounds normal; no masses,  no organomegaly Male genitalia: normal, large cathter in place wtih dark pink urine on irrigation, no clots Extremities: Rt  groin CRRT catheter c/d/i on CRRT.  Lymph nodes: Cervical, supraclavicular, and axillary nodes normal.  Lab Results:  Recent Labs    02/09/20 0312 02/09/20 1451 02-28-2020 0135 February 28, 2020 0135 February 28, 2020 0310 Feb 28, 2020 0430  WBC 17.4*  --  15.3*  --   --   --   HGB 6.0*   < > 8.1*   < > 7.8* 8.8*  HCT 17.1*   < > 23.5*   < > 23.0* 26.0*  PLT 389  --  227  --   --   --    < > = values in this interval not displayed.   BMET Recent Labs    02/09/20 1753 02-28-2020 0133 2020-02-28 0430 02/28/20 0511  NA 137   < > 138 138  K 4.6   < > 4.1 4.5  CL 102  --   --  102  CO2 16*  --   --  19*  GLUCOSE 95  --   --  68*  BUN 42*  --   --  26*  CREATININE 5.36*  --   --  3.14*  CALCIUM 6.1*  --   --  6.4*   < > = values in this interval not displayed.   PT/INR Recent Labs    02/09/20 0533 February 28, 2020 0135  LABPROT 24.1* 25.7*  INR 2.3* 2.4*   ABG Recent Labs    02-28-2020 0310 02/28/20 0430  PHART 7.143* 7.385  HCO3 21.0 22.9    Studies/Results: CT ABDOMEN PELVIS WO CONTRAST  Addendum Date: 02/09/2020   ADDENDUM REPORT: 02/09/2020 17:16 ADDENDUM: These results were called by telephone at  the time of interpretation on 02/09/2020 at 4:33pm to provider RN Shea Stakes on 55M, who verbally acknowledged these results. Electronically Signed   By: Iven Finn M.D.   On: 02/09/2020 17:16   Result Date: 02/09/2020 CLINICAL DATA:  Cancer of unknown primary, staging metastatic disease. Abdominal trauma, gross hematuria. Acute renal failure. Hypertension, cholesterol. Admitted last night with respiratory distress. Also bloody stools X 1 day. EXAM: CT CHEST, ABDOMEN AND PELVIS WITHOUT CONTRAST TECHNIQUE: Multidetector CT imaging of the chest, abdomen and pelvis was performed following the standard protocol without IV contrast. COMPARISON:  None. FINDINGS: CT CHEST FINDINGS Lines and tubes: Left subclavian central venous catheter with tip terminating in the proximal azygos vein. Cardiovascular: Normal heart  size. No significant pericardial effusion. The thoracic aorta is normal in caliber. Mild atherosclerotic plaque of the thoracic aorta. At least 3 vessel mild coronary artery calcifications. Mediastinum/Nodes: No enlarged mediastinal or axillary lymph nodes. No gross hilar adenopathy, noting limited sensitivity for the detection of hilar adenopathy on this noncontrast study. thyroid gland, trachea, and esophagus demonstrate no significant findings. Lungs/Pleura: Right middle lobe linear atelectasis versus scarring. Lingular linear atelectasis versus scarring. Micronodule within the right lower lobe (9:45). Vague no pulmonary mass. Right lower lobe ground-glass airspace opacity measuring up to 8 mm. Bilateral trace pleural effusions, right greater than left, with associated bilateral lower lobe compressive atelectasis. No pneumothorax. Musculoskeletal: Marked diffuse subcutaneus soft tissue edema. No chest wall abnormality No suspicious lytic or blastic osseous lesions. No acute displaced fracture. Multilevel degenerative changes of the spine. CT ABDOMEN PELVIS FINDINGS Hepatobiliary: Innumerable vague, extremely difficult to discern on noncontrast study, hypodense cannonball appearing hepatic lesions within as an example a 4.4 cm lesion (3:25). Gallbladder is grossly unremarkable. No definite biliary ductal dilatation. Pancreas: Unremarkable. No pancreatic ductal dilatation or surrounding inflammatory changes. Spleen: Normal in size without focal abnormality. Adrenals/Urinary Tract: No adrenal nodule bilaterally. No nephrolithiasis, no hydronephrosis, and no contour-deforming renal mass. No ureterolithiasis or hydroureter. Layering fluid-fluid level with hyperdense fluid inferiorly measuring up to 56 Hounsfield units in suggestive of blood products. Foci of gas within the urinary bladder lumen likely related to Foley catheter with tip and inflated balloon noted to terminate within the urinary bladder lumen.  Stomach/Bowel: PO contrast reaches the proximal to mid small bowel. Stomach is within normal limits. Appendix appears normal. No evidence of small bowel wall thickening, distention, or inflammatory changes. Suggestion of mild circumferential bowel wall thickening likely related to ascites and fluid overload. Vascular/Lymphatic: No abdominal aorta or iliac aneurysm. Moderate to severe atherosclerotic plaque of the aorta and its branches. No abdominal, pelvic, or inguinal lymphadenopathy. s. Reproductive: Prostate is not visualized with metallic densities in its place likely representing radiation therapy beads. Other: Small volume simple free fluid ascites within the abdomen and pelvis. No intraperitoneal free gas. No organized fluid collection. Musculoskeletal: Marked diffuse subcutaneus soft tissue edema. No abdominal wall hernia or abnormality No suspicious lytic or blastic osseous lesions. No acute displaced fracture. Multilevel mild degenerative changes of the spine. IMPRESSION: 1. Please note markedly limited evaluation on this noncontrast study. 2. Blood products within the urinary bladder lumen. May be related to underlying malignancy versus trauma versus less likely hemorrhagic cystitis given lack of inflammatory changes. 3. Innumerable poorly defined and incompletely evaluated hypodense hepatic lesions suggestive of metastases versus primary liver malignancy. 4. Findings suggestive of fluid overload with bilateral trace pleural effusions, small volume ascites, anasarca. 5. Left subclavian central venous catheter with tip terminating in the proximal azygos vein.  6. Indeterminate 59mm right lower lobe ground-glass airspace opacity. Attention on follow-up. 7. No acute displaced fracture. Electronically Signed: By: Iven Finn M.D. On: 02/09/2020 15:55   CT HEAD WO CONTRAST  Result Date: 02/09/2020 CLINICAL DATA:  Mental status change, unknown cause metastatic disease. Hypertension, high cholesterol.  Admitted with respiratory distress last night. EXAM: CT HEAD WITHOUT CONTRAST TECHNIQUE: Contiguous axial images were obtained from the base of the skull through the vertex without intravenous contrast. COMPARISON:  None. FINDINGS: Brain: No evidence of large-territorial acute infarction. No parenchymal hemorrhage. Extremely vague hyperdensity within the right insular region likely artifactual (3:17). No mass lesion. No extra-axial collection. No mass effect or midline shift. No hydrocephalus. Basilar cisterns are patent. Vascular: No hyperdense vessel or unexpected calcification. Skull: Normal. Negative for fracture or focal lesion. Sinuses/Orbits: No acute finding. Other: None. IMPRESSION: No acute intracranial abnormality. Electronically Signed   By: Iven Finn M.D.   On: 02/09/2020 15:30   CT CHEST WO CONTRAST  Addendum Date: 02/09/2020   ADDENDUM REPORT: 02/09/2020 17:16 ADDENDUM: These results were called by telephone at the time of interpretation on 02/09/2020 at 4:33pm to provider RN Shea Stakes on 31M, who verbally acknowledged these results. Electronically Signed   By: Iven Finn M.D.   On: 02/09/2020 17:16   Result Date: 02/09/2020 CLINICAL DATA:  Cancer of unknown primary, staging metastatic disease. Abdominal trauma, gross hematuria. Acute renal failure. Hypertension, cholesterol. Admitted last night with respiratory distress. Also bloody stools X 1 day. EXAM: CT CHEST, ABDOMEN AND PELVIS WITHOUT CONTRAST TECHNIQUE: Multidetector CT imaging of the chest, abdomen and pelvis was performed following the standard protocol without IV contrast. COMPARISON:  None. FINDINGS: CT CHEST FINDINGS Lines and tubes: Left subclavian central venous catheter with tip terminating in the proximal azygos vein. Cardiovascular: Normal heart size. No significant pericardial effusion. The thoracic aorta is normal in caliber. Mild atherosclerotic plaque of the thoracic aorta. At least 3 vessel mild coronary artery  calcifications. Mediastinum/Nodes: No enlarged mediastinal or axillary lymph nodes. No gross hilar adenopathy, noting limited sensitivity for the detection of hilar adenopathy on this noncontrast study. thyroid gland, trachea, and esophagus demonstrate no significant findings. Lungs/Pleura: Right middle lobe linear atelectasis versus scarring. Lingular linear atelectasis versus scarring. Micronodule within the right lower lobe (9:45). Vague no pulmonary mass. Right lower lobe ground-glass airspace opacity measuring up to 8 mm. Bilateral trace pleural effusions, right greater than left, with associated bilateral lower lobe compressive atelectasis. No pneumothorax. Musculoskeletal: Marked diffuse subcutaneus soft tissue edema. No chest wall abnormality No suspicious lytic or blastic osseous lesions. No acute displaced fracture. Multilevel degenerative changes of the spine. CT ABDOMEN PELVIS FINDINGS Hepatobiliary: Innumerable vague, extremely difficult to discern on noncontrast study, hypodense cannonball appearing hepatic lesions within as an example a 4.4 cm lesion (3:25). Gallbladder is grossly unremarkable. No definite biliary ductal dilatation. Pancreas: Unremarkable. No pancreatic ductal dilatation or surrounding inflammatory changes. Spleen: Normal in size without focal abnormality. Adrenals/Urinary Tract: No adrenal nodule bilaterally. No nephrolithiasis, no hydronephrosis, and no contour-deforming renal mass. No ureterolithiasis or hydroureter. Layering fluid-fluid level with hyperdense fluid inferiorly measuring up to 56 Hounsfield units in suggestive of blood products. Foci of gas within the urinary bladder lumen likely related to Foley catheter with tip and inflated balloon noted to terminate within the urinary bladder lumen. Stomach/Bowel: PO contrast reaches the proximal to mid small bowel. Stomach is within normal limits. Appendix appears normal. No evidence of small bowel wall thickening, distention,  or inflammatory changes. Suggestion of  mild circumferential bowel wall thickening likely related to ascites and fluid overload. Vascular/Lymphatic: No abdominal aorta or iliac aneurysm. Moderate to severe atherosclerotic plaque of the aorta and its branches. No abdominal, pelvic, or inguinal lymphadenopathy. s. Reproductive: Prostate is not visualized with metallic densities in its place likely representing radiation therapy beads. Other: Small volume simple free fluid ascites within the abdomen and pelvis. No intraperitoneal free gas. No organized fluid collection. Musculoskeletal: Marked diffuse subcutaneus soft tissue edema. No abdominal wall hernia or abnormality No suspicious lytic or blastic osseous lesions. No acute displaced fracture. Multilevel mild degenerative changes of the spine. IMPRESSION: 1. Please note markedly limited evaluation on this noncontrast study. 2. Blood products within the urinary bladder lumen. May be related to underlying malignancy versus trauma versus less likely hemorrhagic cystitis given lack of inflammatory changes. 3. Innumerable poorly defined and incompletely evaluated hypodense hepatic lesions suggestive of metastases versus primary liver malignancy. 4. Findings suggestive of fluid overload with bilateral trace pleural effusions, small volume ascites, anasarca. 5. Left subclavian central venous catheter with tip terminating in the proximal azygos vein. 6. Indeterminate 61mm right lower lobe ground-glass airspace opacity. Attention on follow-up. 7. No acute displaced fracture. Electronically Signed: By: Iven Finn M.D. On: 02/09/2020 15:55   US RENAL  Result Date: 02/09/2020 CLINICAL DATA:  Acute renal insufficiency EXAM: RENAL / URINARY TRACT ULTRASOUND COMPLETE COMPARISON:  None. FINDINGS: Right Kidney: Renal measurements: 11.5 x 4.8 by 4.0 cm = volume: 117 mL. Increased renal cortical echotexture. No nephrolithiasis or hydronephrosis. Left Kidney: Renal measurements:  11.9 x 5.2 by 4.6 cm = volume: 149 mL. Increased renal cortical echotexture. No nephrolithiasis or hydronephrosis. Bladder: There is a large amount of debris within the bladder, likely related to hematuria. Prevoid volume measures 252 cc. The patient is unable to void, and there is an indwelling Foley catheter identified. Other: Trace ascites is identified. Small right pleural effusion incidentally noted. IMPRESSION: 1. Increased renal cortical echotexture consistent with medical renal disease. 2. Large amount of debris layering dependently within the bladder, likely related to blood products. Please correlate with urinalysis. 3. Incidental small right pleural effusion and trace ascites. Electronically Signed   By: Randa Ngo M.D.   On: 02/09/2020 01:16   DG CHEST PORT 1 VIEW  Result Date: 03/05/2020 CLINICAL DATA:  Intubation EXAM: PORTABLE CHEST 1 VIEW COMPARISON:  03-05-20 FINDINGS: Unchanged position of left subclavian catheter. Endotracheal tube tip is just below the level of the clavicular heads. Esophageal catheter tip is below the field of view. No focal airspace consolidation. No pleural abnormality. IMPRESSION: Endotracheal tube tip just below the level of the clavicular heads. Electronically Signed   By: Ulyses Jarred M.D.   On: 03-05-2020 04:10   DG Chest Port 1 View  Result Date: 2020/03/05 CLINICAL DATA:  Respiratory failure EXAM: PORTABLE CHEST 1 VIEW COMPARISON:  02/18/2020 FINDINGS: There is mild right basilar atelectasis. The lungs are otherwise clear no pneumothorax. Tiny right pleural effusion has developed, new since prior examination. Cardiac size within normal limits. Left subclavian chest port with its tip likely oriented within the terminal azygos vein is unchanged. Cardiac size within normal limits. Pulmonary vascularity is normal. IMPRESSION: Interval development of tiny right pleural effusion with associated right basilar atelectasis. Electronically Signed   By: Fidela Salisbury MD   On: Mar 05, 2020 02:53   DG CHEST PORT 1 VIEW  Result Date: 02/14/2020 CLINICAL DATA:  Central line placement EXAM: PORTABLE CHEST 1 VIEW COMPARISON:  02/07/2020 FINDINGS: Left subclavian  approach central venous catheter tip projects over the lower SVC. The tip appears to be oriented either anteriorly or posteriorly. Bibasilar atelectasis.  Normal pleural spaces. IMPRESSION: Left subclavian approach central venous catheter tip projecting over the lower SVC. Electronically Signed   By: Ulyses Jarred M.D.   On: 01/27/2020 22:21   DG Chest Port 1 View  Result Date: 01/31/2020 CLINICAL DATA:  Weakness and shortness of breath EXAM: PORTABLE CHEST 1 VIEW COMPARISON:  September 17, 2007 FINDINGS: The heart size and mediastinal contours are mildly enlarged. Aortic knob calcifications are seen. There is prominence of the central pulmonary vasculature. No acute osseous abnormality. IMPRESSION: Mild cardiomegaly and pulmonary vascular congestion. Electronically Signed   By: Prudencio Pair M.D.   On: 02/14/2020 17:35   ECHOCARDIOGRAM COMPLETE  Result Date: 02/09/2020    ECHOCARDIOGRAM REPORT   Patient Name:   Jason Good Date of Exam: 02/09/2020 Medical Rec #:  [id number]          Height:       66.0 in Accession #:    [id number]         Weight:       125.0 lb Date of Birth:  04-Jul-1950          BSA:          1.638 m Patient Age:    69 years           BP:           119/55 mmHg Patient Gender: M                  HR:           72 bpm. Exam Location:  Inpatient Procedure: 2D Echo, Cardiac Doppler and Color Doppler Indications:    Pericardial effusion  History:        Patient has no prior history of Echocardiogram examinations.                 Risk Factors:Hypertension, Dyslipidemia and Current Smoker.                 ESRD.  Sonographer:    Clayton Lefort RDCS (AE) Referring Phys: [place]  1. Left ventricular ejection fraction, by estimation, is 65 to 70%. The left ventricle has  normal function. The left ventricle has no regional wall motion abnormalities. There is mild concentric left ventricular hypertrophy. Left ventricular diastolic parameters were normal.  2. Right ventricular systolic function is normal. The right ventricular size is normal. There is normal pulmonary artery systolic pressure. The estimated right ventricular systolic pressure is 03.4 mmHg.  3. Left atrial size was mildly dilated.  4. A small pericardial effusion is present. The pericardial effusion is circumferential. There is no evidence of cardiac tamponade.  5. The mitral valve is grossly normal. Trivial mitral valve regurgitation. No evidence of mitral stenosis.  6. The aortic valve is tricuspid. There is mild calcification of the aortic valve. There is mild thickening of the aortic valve. Aortic valve regurgitation is mild. Mild aortic valve sclerosis is present, with no evidence of aortic valve stenosis.  7. The inferior vena cava is normal in size with greater than 50% respiratory variability, suggesting right atrial pressure of 3 mmHg. FINDINGS  Left Ventricle: Left ventricular ejection fraction, by estimation, is 65 to 70%. The left ventricle has normal function. The left ventricle has no regional wall motion abnormalities. The left ventricular internal cavity size was normal in size.  There is  mild concentric left ventricular hypertrophy. Left ventricular diastolic parameters were normal. Right Ventricle: The right ventricular size is normal. No increase in right ventricular wall thickness. Right ventricular systolic function is normal. There is normal pulmonary artery systolic pressure. The tricuspid regurgitant velocity is 2.19 m/s, and  with an assumed right atrial pressure of 3 mmHg, the estimated right ventricular systolic pressure is 86.5 mmHg. Left Atrium: Left atrial size was mildly dilated. Right Atrium: Right atrial size was normal in size. Pericardium: A small pericardial effusion is present. The  pericardial effusion is circumferential. There is no evidence of cardiac tamponade. Mitral Valve: The mitral valve is grossly normal. There is mild thickening of the anterior and posterior mitral valve leaflet(s). Trivial mitral valve regurgitation. No evidence of mitral valve stenosis. Tricuspid Valve: The tricuspid valve is grossly normal. Tricuspid valve regurgitation is trivial. No evidence of tricuspid stenosis. Aortic Valve: The aortic valve is tricuspid. There is mild calcification of the aortic valve. There is mild thickening of the aortic valve. Aortic valve regurgitation is mild. Aortic regurgitation PHT measures 759 msec. Mild aortic valve sclerosis is present, with no evidence of aortic valve stenosis. Aortic valve mean gradient measures 3.0 mmHg. Aortic valve peak gradient measures 8.8 mmHg. Aortic valve area, by VTI measures 2.83 cm. Pulmonic Valve: The pulmonic valve was grossly normal. Pulmonic valve regurgitation is not visualized. No evidence of pulmonic stenosis. Aorta: The aortic root and ascending aorta are structurally normal, with no evidence of dilitation. Venous: The inferior vena cava is normal in size with greater than 50% respiratory variability, suggesting right atrial pressure of 3 mmHg. IAS/Shunts: The atrial septum is grossly normal.  LEFT VENTRICLE PLAX 2D LVIDd:         4.20 cm  Diastology LVIDs:         2.70 cm  LV e' medial:    10.90 cm/s LV PW:         1.50 cm  LV E/e' medial:  7.9 LV IVS:        1.00 cm  LV e' lateral:   13.20 cm/s LVOT diam:     2.10 cm  LV E/e' lateral: 6.5 LV SV:         80 LV SV Index:   49 LVOT Area:     3.46 cm  RIGHT VENTRICLE             IVC RV Basal diam:  2.90 cm     IVC diam: 1.50 cm RV S prime:     13.20 cm/s TAPSE (M-mode): 1.9 cm LEFT ATRIUM             Index       RIGHT ATRIUM           Index LA diam:        2.90 cm 1.77 cm/m  RA Area:     16.20 cm LA Vol (A2C):   61.2 ml 37.37 ml/m RA Volume:   42.50 ml  25.95 ml/m LA Vol (A4C):   50.5 ml  30.84 ml/m LA Biplane Vol: 58.8 ml 35.90 ml/m  AORTIC VALVE AV Area (Vmax):    2.81 cm AV Area (Vmean):   2.84 cm AV Area (VTI):     2.83 cm AV Vmax:           148.00 cm/s AV Vmean:          83.500 cm/s AV VTI:  0.281 m AV Peak Grad:      8.8 mmHg AV Mean Grad:      3.0 mmHg LVOT Vmax:         120.00 cm/s LVOT Vmean:        68.400 cm/s LVOT VTI:          0.230 m LVOT/AV VTI ratio: 0.82 AI PHT:            759 msec  AORTA Ao Root diam: 3.40 cm Ao Asc diam:  3.40 cm MITRAL VALVE               TRICUSPID VALVE MV Area (PHT): 3.17 cm    TR Peak grad:   19.2 mmHg MV Decel Time: 239 msec    TR Vmax:        219.00 cm/s MV E velocity: 86.30 cm/s MV A velocity: 53.80 cm/s  SHUNTS MV E/A ratio:  1.60        Systemic VTI:  0.23 m                            Systemic Diam: 2.10 cm Eleonore Chiquito MD Electronically signed by Eleonore Chiquito MD Signature Date/Time: 02/09/2020/10:59:39 AM    Final     Anti-infectives: Anti-infectives (From admission, onward)   Start     Dose/Rate Route Frequency Ordered Stop   02/09/20 1245  ceFEPIme (MAXIPIME) 2 g in sodium chloride 0.9 % 100 mL IVPB        2 g 200 mL/hr over 30 Minutes Intravenous Every 12 hours 02/09/20 1153        Assessment/Plan:   1 - Acute Renal Failure - suspect pre-renal in setting of sepsis / SIRS + some intrinsic renal dysfunction. No hydro to suggest significant obstructive component. Appreciate nephrology comanagement and agree with CRRT given borderline hemodynamics.   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 Continue 3 way foley on gentle traction and continuous bladder irrigation  to hopefully mitigate. Necessary anticoagulation for CRRT certainly exacerbates.  3 - Prostate Cancer - excellent biochemical control. No mets by imaging.   4 - Liver Lesions / Likely Metastatic Cancer - low suspicion for GU primary given lack of large GU masses, very low PSA, and no pelvic adenopathy.  His prognosis appears exceptionally poor.    Alexis Frock 2020/02/16

## 2020-02-23 DEATH — deceased

## 2021-02-24 IMAGING — DX DG CHEST 1V PORT
1 series · 1 of 1 positions shown · non-contrast
Comparison: 02/10/2020

CLINICAL DATA: Intubation

EXAM:
PORTABLE CHEST 1 VIEW

[chest]
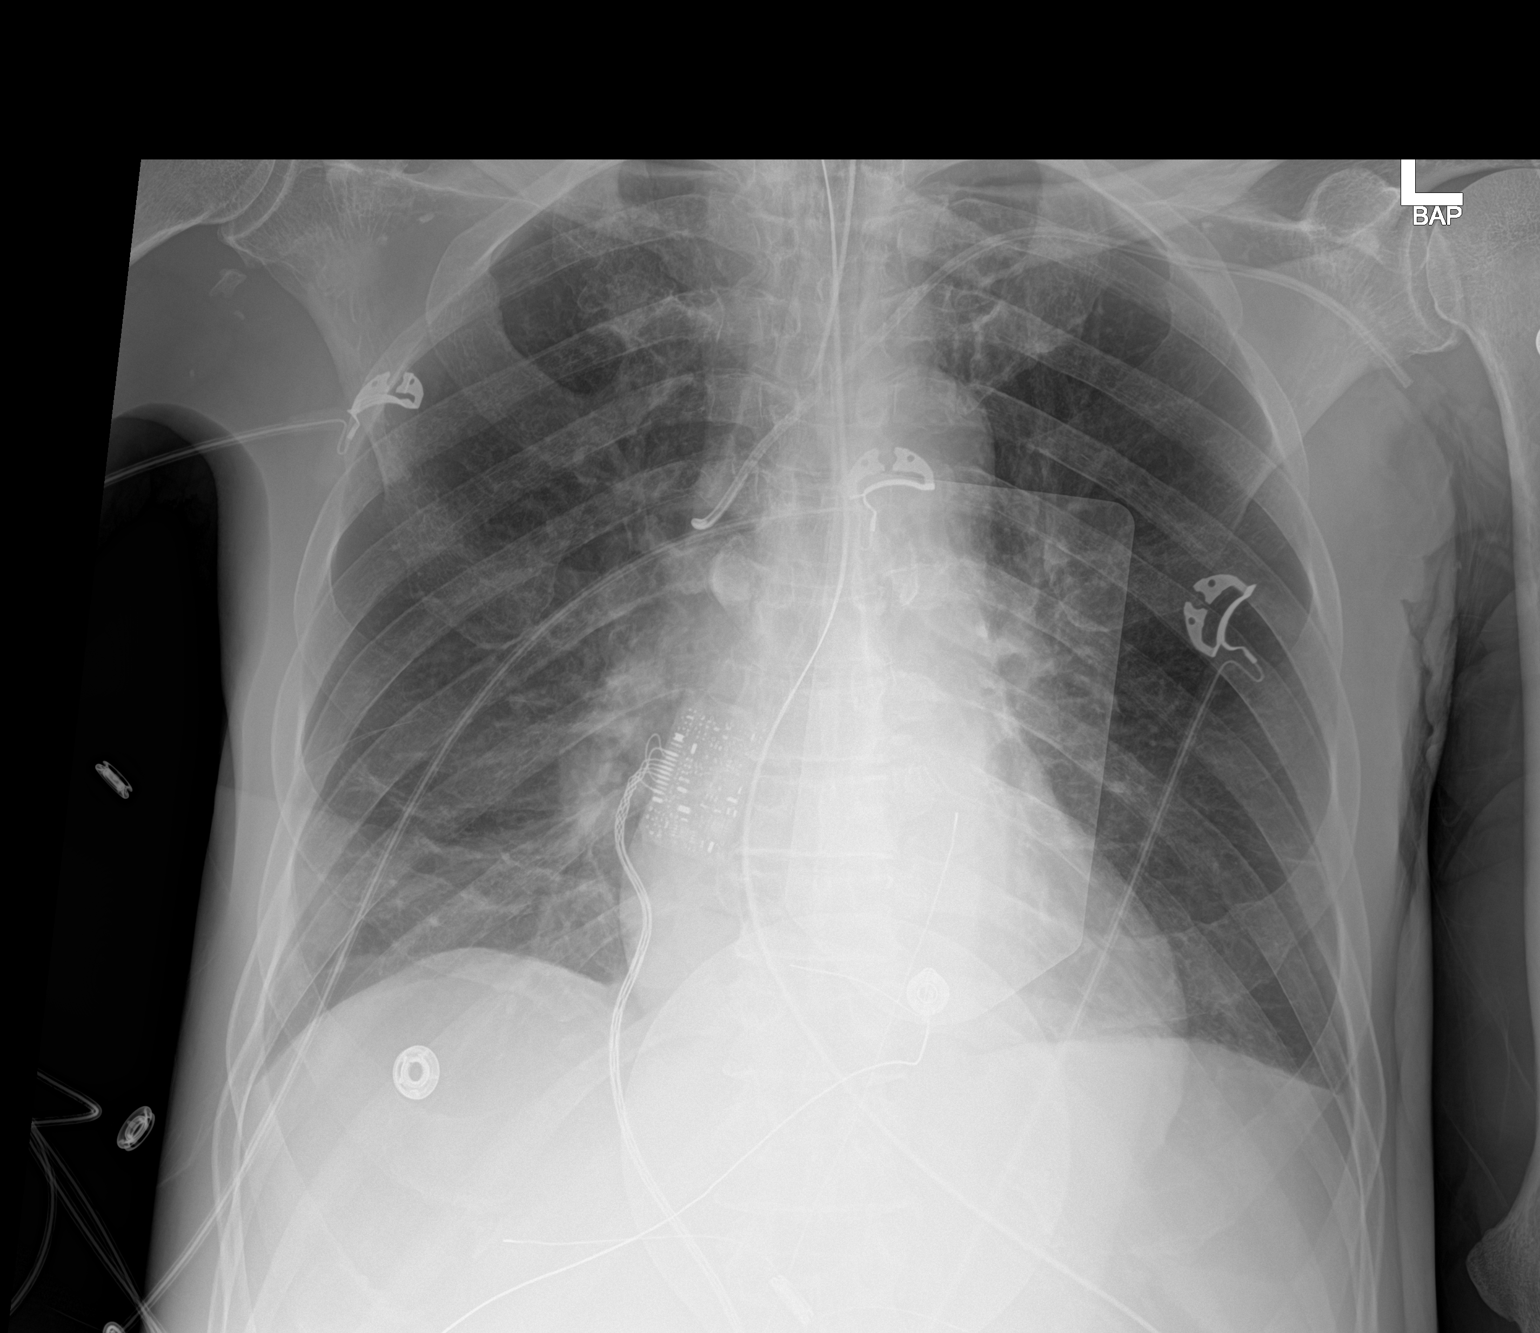

[1 of 1 positions shown; findings below may reference images not displayed]

FINDINGS: Unchanged position of left subclavian catheter. Endotracheal tube
tip is just below the level of the clavicular heads. Esophageal
catheter tip is below the field of view. No focal airspace
consolidation. No pleural abnormality.
IMPRESSION: Endotracheal tube tip just below the level of the clavicular heads.
